# Patient Record
Sex: Male | Born: 1960 | Race: White | Hispanic: No | Marital: Married | Smoking: Current every day smoker
Health system: Southern US, Community
[De-identification: ages and names within clinical notes are randomized; demographics above are authoritative.]

## PROBLEM LIST (undated history)

## (undated) DIAGNOSIS — E785 Hyperlipidemia, unspecified: Secondary | ICD-10-CM

## (undated) DIAGNOSIS — E669 Obesity, unspecified: Secondary | ICD-10-CM

## (undated) DIAGNOSIS — E119 Type 2 diabetes mellitus without complications: Secondary | ICD-10-CM

## (undated) DIAGNOSIS — I10 Essential (primary) hypertension: Secondary | ICD-10-CM

## (undated) DIAGNOSIS — E1169 Type 2 diabetes mellitus with other specified complication: Secondary | ICD-10-CM

## (undated) HISTORY — DX: Hyperlipidemia, unspecified: E78.5

## (undated) HISTORY — PX: VASECTOMY: SHX75

## (undated) HISTORY — DX: Obesity, unspecified: E66.9

## (undated) HISTORY — DX: Essential (primary) hypertension: I10

## (undated) HISTORY — DX: Type 2 diabetes mellitus with other specified complication: E66.9

## (undated) HISTORY — DX: Type 2 diabetes mellitus with other specified complication: E11.69

---
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
## 2010-07-08 HISTORY — PX: COLONOSCOPY: SHX174

## 2019-04-05 ENCOUNTER — Ambulatory Visit (INDEPENDENT_AMBULATORY_CARE_PROVIDER_SITE_OTHER): Payer: Managed Care, Other (non HMO) | Admitting: Family Medicine

## 2019-04-05 ENCOUNTER — Other Ambulatory Visit: Payer: Self-pay

## 2019-04-05 ENCOUNTER — Encounter: Payer: Self-pay | Admitting: Family Medicine

## 2019-04-05 VITALS — BP 120/78 | HR 100 | Temp 97.9°F | Ht 66.0 in | Wt 176.4 lb

## 2019-04-05 DIAGNOSIS — Z0000 Encounter for general adult medical examination without abnormal findings: Secondary | ICD-10-CM | POA: Diagnosis not present

## 2019-04-05 DIAGNOSIS — E118 Type 2 diabetes mellitus with unspecified complications: Secondary | ICD-10-CM | POA: Diagnosis not present

## 2019-04-05 DIAGNOSIS — Z23 Encounter for immunization: Secondary | ICD-10-CM | POA: Diagnosis not present

## 2019-04-05 DIAGNOSIS — Z2821 Immunization not carried out because of patient refusal: Secondary | ICD-10-CM | POA: Diagnosis not present

## 2019-04-05 NOTE — Addendum Note (Signed)
Addended by: Sharon Seller B on: 04/05/2019 04:07 PM   Modules accepted: Orders

## 2019-04-05 NOTE — Patient Instructions (Addendum)
Give [place] 2-3 business days to get the results of your labs back.   Keep the diet clean and stay active.  I recommend getting the flu shot in mid October. This suggestion would change if the CDC comes out with a different recommendation.   The new Shingrix vaccine (for shingles) is a 2 shot series. It can make people feel low energy, achy and almost like they have the flu for 48 hours after injection. Please plan accordingly when deciding on when to get this shot. Call our office for a nurse visit appointment to get this. The second shot of the series is less severe regarding the side effects, but it still lasts 48 hours.    Call your eye doctor for an appointment.   Call your insurance company regarding if they pay for painful/irritated skin tag removal. If they do, let me know and we can remove them.   Let us know if you need anything.

## 2019-04-05 NOTE — Progress Notes (Signed)
Chief Complaint  Patient presents with  . New Patient (Initial Visit)    Well Male Jeffery Farrell is here for a complete physical.   His last physical was >1 year ago.  Current diet: in general, a "healthy" diet.  Current exercise: walking, active at work Weight trend: stable Daytime fatigue? No. Seat belt? Yes.    Health maintenance Shingrix- No Colonoscopy- Yes 09/27/2011 Tetanus- No HIV- No Hep C- Yes  Past Medical History:  Diagnosis Date  . Diabetes mellitus without complication ([place])   . Hyperlipidemia   . Hypertension      History reviewed. No pertinent surgical history.  Medications  Current Outpatient Medications on File Prior to Visit  Medication Sig Dispense Refill  . ALPRAZolam (XANAX) 0.5 MG tablet Take 1 tablet by mouth daily as needed.    . canagliflozin (INVOKANA) 300 MG TABS tablet Take 1 tablet by mouth daily.    Marland Kitchen glimepiride (AMARYL) 2 MG tablet Take 1 tablet by mouth daily.    Marland Kitchen lisinopril (ZESTRIL) 20 MG tablet Take 1 tablet by mouth daily.    Marland Kitchen atorvastatin (LIPITOR) 40 MG tablet Take 1 tablet by mouth daily.    Marland Kitchen levothyroxine (SYNTHROID) 150 MCG tablet Take 1 tablet by mouth daily.    Marland Kitchen omeprazole (PRILOSEC) 40 MG capsule Take 1 capsule by mouth daily.     Allergies No Known Allergies  Family History History reviewed. No pertinent family history.  Review of Systems: Constitutional:  no fevers Eye:  no recent significant change in vision Ear/Nose/Mouth/Throat:  Ears:  no hearing loss Nose/Mouth/Throat:  no complaints of nasal congestion, no sore throat Cardiovascular:  no chest pain, no palpitations Respiratory:  no cough and no shortness of breath Gastrointestinal:  no abdominal pain, no change in bowel habits GU:  Male: negative for dysuria, frequency, and incontinence and negative for prostate symptoms Musculoskeletal/Extremities:  no pain, redness, or swelling of the joints Integumentary (Skin/Breast):  no abnormal skin lesions  reported Neurologic:  no headaches Endocrine: No unexpected weight changes Hematologic/Lymphatic:  no abnormal bleeding  Exam BP 120/78 (BP Location: Left Arm, Patient Position: Sitting, Cuff Size: Normal)   Pulse 100   Temp 97.9 F (36.6 C) (Temporal)   Ht 5\' 6"  (1.676 m)   Wt 176 lb 6 oz (80 kg)   SpO2 96%   BMI 28.47 kg/m  General:  well developed, well nourished, in no apparent distress Skin:  no significant moles, warts, or growths Head:  no masses, lesions, or tenderness Eyes:  pupils equal and round, sclera anicteric without injection Ears:  canals without lesions, TMs shiny without retraction, no obvious effusion, no erythema Nose:  nares patent, septum midline, mucosa normal Throat/Pharynx:  lips and gingiva without lesion; tongue and uvula midline; non-inflamed pharynx; no exudates or postnasal drainage Neck: neck supple without adenopathy, thyromegaly, or masses Lungs:  clear to auscultation, breath sounds equal bilaterally, no respiratory distress Cardio:  regular rate and rhythm, no LE edema, no bruits Abdomen:  abdomen soft, nontender; bowel sounds normal; no masses or organomegaly Rectal: Deferred Musculoskeletal:  symmetrical muscle groups noted without atrophy or deformity Extremities:  no clubbing, cyanosis, or edema, no deformities, no skin discoloration Neuro:  gait normal; deep tendon reflexes normal and symmetric Psych: well oriented with normal range of affect and appropriate judgment/insight  Assessment and Plan  Well adult exam - Plan: CBC, Comprehensive metabolic panel, TSH, Lipid panel, HIV Antibody (routine testing w rflx)  Controlled type 2 diabetes mellitus with complication, without  long-term current use of insulin ([place]) - Plan: Hemoglobin A1c, Microalbumin / creatinine urine ratio  Refused influenza vaccine   Well 58 y.o. male. Counseled on diet and exercise. Counseled on risks and benefits of prostate cancer screening with PSA. The patient  agrees to forego testing. Immunizations, labs, and further orders as above. Follow up in 6 mo or prn. The patient voiced understanding and agreement to the plan.  [place], DO 04/05/19 4:03 PM

## 2019-04-06 LAB — CBC
HCT: 40.4 % (ref 39.0–52.0)
Hemoglobin: 13.8 g/dL (ref 13.0–17.0)
MCHC: 34.3 g/dL (ref 30.0–36.0)
MCV: 94.9 fl (ref 78.0–100.0)
Platelets: 273 10*3/uL (ref 150.0–400.0)
RBC: 4.26 Mil/uL (ref 4.22–5.81)
RDW: 12.8 % (ref 11.5–15.5)
WBC: 8.1 10*3/uL (ref 4.0–10.5)

## 2019-04-06 LAB — LIPID PANEL
Cholesterol: 157 mg/dL (ref 0–200)
HDL: 32.1 mg/dL — ABNORMAL LOW (ref >39.00)
Total CHOL/HDL Ratio: 5
Triglycerides: 551 mg/dL — ABNORMAL HIGH (ref 0.0–149.0)

## 2019-04-06 LAB — COMPREHENSIVE METABOLIC PANEL
ALT: 34 U/L (ref 0–53)
AST: 23 U/L (ref 0–37)
Albumin: 4.5 g/dL (ref 3.5–5.2)
Alkaline Phosphatase: 64 U/L (ref 39–117)
BUN: 16 mg/dL (ref 6–23)
CO2: 25 mEq/L (ref 19–32)
Calcium: 9.9 mg/dL (ref 8.4–10.5)
Chloride: 99 mEq/L (ref 96–112)
Creatinine, Ser: 1.13 mg/dL (ref 0.40–1.50)
GFR: 66.26 mL/min (ref >60.00)
Glucose, Bld: 153 mg/dL — ABNORMAL HIGH (ref 70–99)
Potassium: 4.2 mEq/L (ref 3.5–5.1)
Sodium: 134 mEq/L — ABNORMAL LOW (ref 135–145)
Total Bilirubin: 0.7 mg/dL (ref 0.2–1.2)
Total Protein: 7.6 g/dL (ref 6.0–8.3)

## 2019-04-06 LAB — TSH: TSH: 0.82 u[IU]/mL (ref 0.35–4.50)

## 2019-04-06 LAB — HIV ANTIBODY (ROUTINE TESTING W REFLEX): HIV 1&2 Ab, 4th Generation: NONREACTIVE

## 2019-04-06 LAB — HEMOGLOBIN A1C: Hgb A1c MFr Bld: 8.4 % — ABNORMAL HIGH (ref 4.6–6.5)

## 2019-04-06 LAB — LDL CHOLESTEROL, DIRECT: Direct LDL: 67 mg/dL

## 2019-04-06 LAB — MICROALBUMIN / CREATININE URINE RATIO
Creatinine,U: 40.2 mg/dL
Microalb Creat Ratio: 1.7 mg/g (ref 0.0–30.0)
Microalb, Ur: <0.7 mg/dL (ref 0.0–1.9)

## 2019-04-07 ENCOUNTER — Telehealth: Payer: Self-pay

## 2019-04-07 ENCOUNTER — Other Ambulatory Visit: Payer: Self-pay | Admitting: Family Medicine

## 2019-04-07 DIAGNOSIS — E785 Hyperlipidemia, unspecified: Secondary | ICD-10-CM

## 2019-04-07 MED ORDER — SITAGLIPTIN PHOSPHATE 100 MG PO TABS: 100.0000 mg | ORAL_TABLET | Freq: Every day | ORAL | 3 refills | Status: DC

## 2019-04-07 NOTE — Telephone Encounter (Signed)
PA approved.  [id number];Review Type:Prior Auth;Coverage Start Date:04/07/2019;Coverage End Date:07/07/2098

## 2019-04-07 NOTE — Telephone Encounter (Signed)
PA initiated via Covermymeds; KEY: [id number]. Awaiting determination.

## 2019-04-08 NOTE — Telephone Encounter (Signed)
Do we have any payment cards for Januvia? Onglyza we may have payment cards. Ty.

## 2019-04-08 NOTE — Telephone Encounter (Signed)
Patient called to inform the doctor that the medication he prescribed sitaGLIPtin (JANUVIA) 100 MG tablet, is too expensive ($400).  He stated that the doctor told him to let him know and he could get an alternative.  Please advise and call to discuss.  CB# [phone]

## 2019-04-08 NOTE — Telephone Encounter (Signed)
Please advise 

## 2019-04-09 MED ORDER — SAXAGLIPTIN HCL 5 MG PO TABS: 5.0000 mg | ORAL_TABLET | Freq: Every day | ORAL | 4 refills | Status: DC

## 2019-04-09 NOTE — Telephone Encounter (Signed)
Sent!

## 2019-04-09 NOTE — Telephone Encounter (Signed)
Found one of Onglyza

## 2019-04-09 NOTE — Telephone Encounter (Signed)
Let's send that to him/or he can pick up and call in that. Ty.

## 2019-04-09 NOTE — Addendum Note (Signed)
Addended by: Ames Coupe on: 04/09/2019 02:39 PM   Modules accepted: Orders

## 2019-04-09 NOTE — Telephone Encounter (Signed)
I have spoken to the patient and I am mailing the coupon card to him for onglyza--Please send in to pharmacy

## 2019-04-21 ENCOUNTER — Telehealth: Payer: Self-pay | Admitting: Family Medicine

## 2019-04-21 ENCOUNTER — Other Ambulatory Visit (INDEPENDENT_AMBULATORY_CARE_PROVIDER_SITE_OTHER): Payer: Managed Care, Other (non HMO)

## 2019-04-21 ENCOUNTER — Other Ambulatory Visit: Payer: Self-pay

## 2019-04-21 ENCOUNTER — Other Ambulatory Visit: Payer: Self-pay | Admitting: Family Medicine

## 2019-04-21 DIAGNOSIS — E785 Hyperlipidemia, unspecified: Secondary | ICD-10-CM | POA: Diagnosis not present

## 2019-04-21 LAB — LIPID PANEL
Cholesterol: 162 mg/dL (ref 0–200)
HDL: 34.8 mg/dL — ABNORMAL LOW (ref >39.00)
Total CHOL/HDL Ratio: 5
Triglycerides: 479 mg/dL — ABNORMAL HIGH (ref 0.0–149.0)

## 2019-04-21 LAB — LDL CHOLESTEROL, DIRECT: Direct LDL: 68 mg/dL

## 2019-04-21 MED ORDER — FENOFIBRATE MICRONIZED 67 MG PO CAPS: 67.0000 mg | ORAL_CAPSULE | Freq: Every day | ORAL | 3 refills | Status: DC

## 2019-04-21 NOTE — Telephone Encounter (Signed)
Error

## 2019-04-23 ENCOUNTER — Ambulatory Visit (INDEPENDENT_AMBULATORY_CARE_PROVIDER_SITE_OTHER): Payer: Managed Care, Other (non HMO) | Admitting: Family Medicine

## 2019-04-23 ENCOUNTER — Other Ambulatory Visit: Payer: Self-pay

## 2019-04-23 ENCOUNTER — Encounter: Payer: Self-pay | Admitting: Family Medicine

## 2019-04-23 VITALS — BP 136/84 | HR 92 | Temp 97.8°F | Ht 66.0 in | Wt 176.4 lb

## 2019-04-23 DIAGNOSIS — E669 Obesity, unspecified: Secondary | ICD-10-CM | POA: Diagnosis not present

## 2019-04-23 DIAGNOSIS — E1169 Type 2 diabetes mellitus with other specified complication: Secondary | ICD-10-CM | POA: Insufficient documentation

## 2019-04-23 MED ORDER — PIOGLITAZONE HCL 30 MG PO TABS: 30.0000 mg | ORAL_TABLET | Freq: Every day | ORAL | 3 refills | Status: DC

## 2019-04-23 NOTE — Progress Notes (Signed)
Chief Complaint  Patient presents with  . Follow-up    Subjective: Patient is a 58 y.o. male here for Dm discussion.  Most recent A1c was elevated at 8.4. He is compliant with Invokana 300 mg/d and Amaryl 2 mg/d. Januvia and Onglyza added, neither filled 2/2 cost. Diet is poor currently and he is not very active. Has not been checking sugars routinely.  ROS: Endo: No wt loss  Past Medical History:  Diagnosis Date  . Diabetes mellitus without complication ([place])   . Hyperlipidemia   . Hypertension     Objective: BP 136/84 (BP Location: Left Arm, Patient Position: Sitting, Cuff Size: Normal)   Pulse 92   Temp 97.8 F (36.6 C) (Temporal)   Ht 5\' 6"  (1.676 m)   Wt 176 lb 6 oz (80 kg)   SpO2 93%   BMI 28.47 kg/m  General: Awake, appears stated age Heart: RRR, no bruits, no LE edema Lungs: CTAB, no rales, wheezes or rhonchi. No accessory muscle use Psych: Age appropriate judgment and insight, normal affect and mood  Assessment and Plan: Diabetes mellitus type 2 in obese ([place]) - Plan: pioglitazone (ACTOS) 30 MG tablet  Orders as above. Will hold off on adding GLP-1 or DPP-4 inhibitor. Actos daily, cont Amaryl and Invokana. Counseled on diet and exercise. F/u in 3 mo.  The patient voiced understanding and agreement to the plan.  [place], DO 04/23/19  2:07 PM

## 2019-04-23 NOTE — Patient Instructions (Signed)
Let me know if there are any cost issues.  Keep the diet clean and stay active.  Goal weight for 3 month visit: 165-170 lbs on our scales  Let us know if you need anything.

## 2019-07-05 ENCOUNTER — Ambulatory Visit: Payer: Self-pay | Admitting: *Deleted

## 2019-07-05 NOTE — Telephone Encounter (Signed)
FYI

## 2019-07-05 NOTE — Telephone Encounter (Signed)
Summary: covid testing    Pt called and stated that he was exposed on 07/04/19 to covid and would like to know when he should go and get tested. Please advise      Patient's son tested + COVID and patient was with him yesterday. Advised wait 5 days from exposure before testing- if has symptoms can test earlier. Note to PCP- patient has appointment for diabetic follow up 1/22. Reason for Disposition . [1] CLOSE CONTACT COVID-19 EXPOSURE within last 14 days AND [2] NO symptoms  Answer Assessment - Initial Assessment Questions 1. COVID-19 CLOSE CONTACT: "Who is the person with the confirmed or suspected COVID-19 infection that you were exposed to?"     Son was at house 2. PLACE of CONTACT: "Where were you when you were exposed to COVID-19?" (e.g., home, school, medical waiting room; which city?)     12/27 3. TYPE of CONTACT: "How much contact was there?" (e.g., sitting next to, live in same house, work in same office, same building)     Visit in the home 4. DURATION of CONTACT: "How long were you in contact with the COVID-19 patient?" (e.g., a few seconds, passed by person, a few minutes, 15 minutes or longer, live with the patient)     Half day 5. MASK: "Were you wearing a mask?" "Was the other person wearing a mask?" Note: wearing a mask reduces the risk of an  otherwise close contact.     no 6. DATE of CONTACT: "When did you have contact with a COVID-19 patient?" (e.g., how many days ago)     12/27 7. COMMUNITY SPREAD: "Are there lots of cases of COVID-19 (community spread) where you live?" (See public health department website, if unsure)       community 8. SYMPTOMS: "Do you have any symptoms?" (e.g., fever, cough, breathing difficulty, loss of taste or smell)     No symptoms 9. PREGNANCY OR POSTPARTUM: "Is there any chance you are pregnant?" "When was your last menstrual period?" "Did you deliver in the last 2 weeks?"     no 10. HIGH RISK: "Do you have any heart or lung problems? Do you  have a weak immune system?" (e.g., heart failure, COPD, asthma, HIV positive, chemotherapy, renal failure, diabetes mellitus, sickle cell anemia, obesity)      Type 2 diabetic 11.  TRAVEL: "Have you traveled out of the country recently?" If so, "When and where?"  Also ask about out-of-state travel, since the CDC has identified some high-risk cities for community spread in the [place].  Note: Travel becomes less relevant if there is widespread community transmission where the patient lives.       No travel  Protocols used: CORONAVIRUS (COVID-19) EXPOSURE-A-AH

## 2019-07-07 ENCOUNTER — Ambulatory Visit: Payer: Managed Care, Other (non HMO) | Admitting: Family Medicine

## 2019-07-08 ENCOUNTER — Ambulatory Visit: Payer: Managed Care, Other (non HMO) | Attending: Internal Medicine

## 2019-07-08 DIAGNOSIS — Z20822 Contact with and (suspected) exposure to covid-19: Secondary | ICD-10-CM

## 2019-07-09 LAB — NOVEL CORONAVIRUS, NAA: SARS-CoV-2, NAA: NOT DETECTED

## 2019-07-14 ENCOUNTER — Telehealth: Payer: Self-pay | Admitting: Family Medicine

## 2019-07-14 NOTE — Telephone Encounter (Signed)
Pt is aware covid 19 test is neg on 07/14/2019 

## 2019-07-14 NOTE — Telephone Encounter (Signed)
Pt would like you to fax his results to his employer.  Pt declined mychart.  Pt states he does not have access to a computer, and unsure how to do. 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  [place] Attn: Binnie Kand Fax:  [phone]

## 2019-07-24 ENCOUNTER — Other Ambulatory Visit: Payer: Self-pay | Admitting: Family Medicine

## 2019-07-24 DIAGNOSIS — E1169 Type 2 diabetes mellitus with other specified complication: Secondary | ICD-10-CM

## 2019-07-24 DIAGNOSIS — E669 Obesity, unspecified: Secondary | ICD-10-CM

## 2019-07-29 ENCOUNTER — Other Ambulatory Visit: Payer: Self-pay

## 2019-07-30 ENCOUNTER — Encounter: Payer: Self-pay | Admitting: Family Medicine

## 2019-07-30 ENCOUNTER — Ambulatory Visit (INDEPENDENT_AMBULATORY_CARE_PROVIDER_SITE_OTHER): Payer: Managed Care, Other (non HMO) | Admitting: Family Medicine

## 2019-07-30 VITALS — BP 130/82 | HR 82 | Temp 97.3°F | Ht 65.0 in | Wt 184.0 lb

## 2019-07-30 DIAGNOSIS — E1169 Type 2 diabetes mellitus with other specified complication: Secondary | ICD-10-CM | POA: Diagnosis not present

## 2019-07-30 DIAGNOSIS — R635 Abnormal weight gain: Secondary | ICD-10-CM | POA: Diagnosis not present

## 2019-07-30 DIAGNOSIS — E669 Obesity, unspecified: Secondary | ICD-10-CM

## 2019-07-30 LAB — HEMOGLOBIN A1C: Hgb A1c MFr Bld: 7 % — ABNORMAL HIGH (ref 4.6–6.5)

## 2019-07-30 LAB — T4, FREE: Free T4: 0.75 ng/dL (ref 0.60–1.60)

## 2019-07-30 LAB — TSH: TSH: 4.86 u[IU]/mL — ABNORMAL HIGH (ref 0.35–4.50)

## 2019-07-30 NOTE — Progress Notes (Signed)
Subjective:   Chief Complaint  Patient presents with  . Follow-up    diabetes    Jeffery Farrell is a 59 y.o. male here for follow-up of diabetes.   Jeffery Farrell does not ck sugars.  Patient does not require insulin.   Medications include: Amaryl 2 mg/d, Actos 30 mg/d, Invokana 300 mg/d; not interested in needles, does not do well w Metformin, DPP-4 inhibitors are too expensive Diet: Reports diet is healthy overall but has gained wt abruptly since last visit.  Exercise: walking  Past Medical History:  Diagnosis Date  . Diabetes mellitus without complication ([place])   . Hyperlipidemia   . Hypertension      Related testing: Date of retinal exam: Done Pneumovax: done Flu Shot: not done  Review of Systems: Pulmonary:  No SOB Cardiovascular:  No chest pain  Objective:  BP 130/82 (BP Location: Left Arm, Patient Position: Sitting, Cuff Size: Normal)   Pulse 82   Temp (!) 97.3 F (36.3 C) (Temporal)   Ht 5\' 5"  (1.651 m)   Wt 184 lb (83.5 kg)   SpO2 95%   BMI 30.62 kg/m  General:  Well developed, well nourished, in no apparent distress Skin:  Warm, no pallor or diaphoresis Head:  Normocephalic, atraumatic Eyes:  Pupils equal and round, sclera anicteric without injection  Lungs:  CTAB, no access msc use Cardio:  RRR, no bruits, no LE edema Musculoskeletal:  Symmetrical muscle groups noted without atrophy or deformity Neuro:  Sensation intact to pinprick on feet Psych: Age appropriate judgment and insight  Assessment:   Diabetes mellitus type 2 in obese ([facility]) - Plan: Hemoglobin A1c, HM DIABETES FOOT EXAM  Weight gain - Plan: TSH, T4, free   Plan:   Not interested in GLP-1's. Could consider wt loss team vs dietician. Counseled on diet and exercise. Ck thyroid.  F/u in 3 mo. The patient voiced understanding and agreement to the plan.  [place], DO 07/30/19 1:22 PM

## 2019-07-30 NOTE — Patient Instructions (Signed)
Give us 2-3 business days to get the results of your labs back.   Keep the diet clean and stay active.  Let us know if you need anything. 

## 2019-10-01 ENCOUNTER — Ambulatory Visit: Payer: Managed Care, Other (non HMO) | Admitting: Family Medicine

## 2019-10-28 ENCOUNTER — Other Ambulatory Visit: Payer: Self-pay

## 2019-10-29 ENCOUNTER — Other Ambulatory Visit: Payer: Self-pay | Admitting: Family Medicine

## 2019-10-29 ENCOUNTER — Encounter: Payer: Self-pay | Admitting: Family Medicine

## 2019-10-29 ENCOUNTER — Ambulatory Visit (INDEPENDENT_AMBULATORY_CARE_PROVIDER_SITE_OTHER): Payer: Managed Care, Other (non HMO) | Admitting: Family Medicine

## 2019-10-29 VITALS — BP 132/78 | HR 93 | Temp 96.1°F | Ht 66.0 in | Wt 185.1 lb

## 2019-10-29 DIAGNOSIS — E669 Obesity, unspecified: Secondary | ICD-10-CM | POA: Diagnosis not present

## 2019-10-29 DIAGNOSIS — F411 Generalized anxiety disorder: Secondary | ICD-10-CM | POA: Diagnosis not present

## 2019-10-29 DIAGNOSIS — E1169 Type 2 diabetes mellitus with other specified complication: Secondary | ICD-10-CM | POA: Diagnosis not present

## 2019-10-29 DIAGNOSIS — E782 Mixed hyperlipidemia: Secondary | ICD-10-CM

## 2019-10-29 LAB — LIPID PANEL
Cholesterol: 186 mg/dL (ref 0–200)
HDL: 36.3 mg/dL — ABNORMAL LOW (ref >39.00)
NonHDL: 149.88
Total CHOL/HDL Ratio: 5
Triglycerides: 201 mg/dL — ABNORMAL HIGH (ref 0.0–149.0)
VLDL: 40.2 mg/dL — ABNORMAL HIGH (ref 0.0–40.0)

## 2019-10-29 LAB — COMPREHENSIVE METABOLIC PANEL
ALT: 43 U/L (ref 0–53)
AST: 32 U/L (ref 0–37)
Albumin: 4.7 g/dL (ref 3.5–5.2)
Alkaline Phosphatase: 52 U/L (ref 39–117)
BUN: 14 mg/dL (ref 6–23)
CO2: 28 mEq/L (ref 19–32)
Calcium: 9.7 mg/dL (ref 8.4–10.5)
Chloride: 99 mEq/L (ref 96–112)
Creatinine, Ser: 1.13 mg/dL (ref 0.40–1.50)
GFR: 66.13 mL/min (ref >60.00)
Glucose, Bld: 117 mg/dL — ABNORMAL HIGH (ref 70–99)
Potassium: 4.4 mEq/L (ref 3.5–5.1)
Sodium: 137 mEq/L (ref 135–145)
Total Bilirubin: 0.8 mg/dL (ref 0.2–1.2)
Total Protein: 7.3 g/dL (ref 6.0–8.3)

## 2019-10-29 LAB — LDL CHOLESTEROL, DIRECT: Direct LDL: 111 mg/dL

## 2019-10-29 LAB — HEMOGLOBIN A1C: Hgb A1c MFr Bld: 6.7 % — ABNORMAL HIGH (ref 4.6–6.5)

## 2019-10-29 MED ORDER — FENOFIBRATE 145 MG PO TABS
145.0000 mg | ORAL_TABLET | Freq: Every day | ORAL | 2 refills | Status: DC
Start: 2019-10-29 — End: 2020-01-21

## 2019-10-29 MED ORDER — LISINOPRIL 20 MG PO TABS: 10.0000 mg | ORAL_TABLET | Freq: Every day | ORAL | Status: DC

## 2019-10-29 MED ORDER — ALPRAZOLAM 0.5 MG PO TABS: 0.5000 mg | ORAL_TABLET | Freq: Every day | ORAL | 1 refills | Status: DC | PRN

## 2019-10-29 NOTE — Patient Instructions (Addendum)
Give [place] 2-3 business days to get the results of your labs back.   Keep the diet clean and stay active.  Call your eye provider to get an exam.   Keep an eye on your blood pressure at home. We can take 1/2 tab of the lisinopril (which is for blood pressure). I want your blood pressure less than 140 on the top and less than 90 on the bottom consistently.   Let us know if you need anything.

## 2019-10-29 NOTE — Progress Notes (Signed)
Subjective:   Chief Complaint  Patient presents with  . Follow-up    Jeffery Farrell is a 59 y.o. male here for follow-up of diabetes.   Jeffery Farrell does not monitor his blood pressures.  Patient does not require insulin.   Medications include: glimepiride 2 mg daily, Actos 30 mg/d Diet is fair Exercise: walking  Hypertension Patient presents for hypertension follow up. He does monitor home blood pressures. Blood pressures ranging on average from 120-130's/70's. He is compliant with medication- lisinopril 20 mg/d. Patient has these side effects of medication: none  Anxiety Pt w hx of anxiety. Takes Xanax intermittently. Last filled 30 tabs around 1 yr ago.   Past Medical History:  Diagnosis Date  . Diabetes mellitus without complication (Jeffery Farrell)   . Hyperlipidemia   . Hypertension      Related testing: Date of retinal exam: Due Pneumovax: done   Objective:  BP 132/78 (BP Location: Left Arm, Patient Position: Sitting, Cuff Size: Normal)   Pulse 93   Temp (!) 96.1 F (35.6 C) (Temporal)   Ht 5\' 6"  (1.676 m)   Wt 185 lb 2 oz (84 kg)   SpO2 95%   BMI 29.88 kg/m  General:  Well developed, well nourished, in no apparent distress Skin:  Warm, no pallor or diaphoresis Head:  Normocephalic, atraumatic Eyes:  Pupils equal and round, sclera anicteric without injection  Lungs:  CTAB, no access msc use Cardio:  RRR, no bruits, no LE edema Psych: Age appropriate judgment and insight  Assessment:   Diabetes mellitus type 2 in obese ([facility]) - Plan: Comprehensive metabolic panel, Hemoglobin A1c  Mixed hyperlipidemia - Plan: Lipid panel  GAD (generalized anxiety disorder)   Plan:   1- I think he will be controlled today. Counseled on diet and exercise. Could increase Amaryl.  2- Cont statin and fibrate. Ck lipids today.  3- Uses scant amounts of benzo, will refill.  F/u in 3-6 mo pending above.. The patient voiced understanding and agreement to the plan.  [place],  DO 10/29/19 7:13 AM

## 2019-11-30 ENCOUNTER — Other Ambulatory Visit: Payer: Self-pay | Admitting: Family Medicine

## 2019-11-30 DIAGNOSIS — E1169 Type 2 diabetes mellitus with other specified complication: Secondary | ICD-10-CM

## 2019-12-10 ENCOUNTER — Other Ambulatory Visit: Payer: Self-pay | Admitting: Family Medicine

## 2019-12-10 ENCOUNTER — Other Ambulatory Visit: Payer: Self-pay

## 2019-12-10 ENCOUNTER — Other Ambulatory Visit (INDEPENDENT_AMBULATORY_CARE_PROVIDER_SITE_OTHER): Payer: Managed Care, Other (non HMO)

## 2019-12-10 DIAGNOSIS — E782 Mixed hyperlipidemia: Secondary | ICD-10-CM | POA: Diagnosis not present

## 2019-12-10 LAB — LIPID PANEL
Cholesterol: 165 mg/dL (ref 0–200)
HDL: 36.4 mg/dL — ABNORMAL LOW (ref >39.00)
NonHDL: 128.85
Total CHOL/HDL Ratio: 5
Triglycerides: 232 mg/dL — ABNORMAL HIGH (ref 0.0–149.0)
VLDL: 46.4 mg/dL — ABNORMAL HIGH (ref 0.0–40.0)

## 2019-12-10 LAB — LDL CHOLESTEROL, DIRECT: Direct LDL: 91 mg/dL

## 2019-12-10 MED ORDER — ATORVASTATIN CALCIUM 80 MG PO TABS: 80.0000 mg | ORAL_TABLET | Freq: Every day | ORAL | 5 refills | Status: DC

## 2020-01-12 ENCOUNTER — Telehealth: Payer: Self-pay

## 2020-01-12 NOTE — Telephone Encounter (Signed)
Called to confirm 2 pm appointment is ok for him for Friday January 14, 2020. Is a same day appt and will have manager put him on.

## 2020-01-12 NOTE — Telephone Encounter (Signed)
Caller states he was placed on cholesterol medication. He is having weight gain and muscle cramps. Last night he only received about 3 hours sleep due to the cramps. He is requesting an appointment on a Friday after lunch

## 2020-01-14 ENCOUNTER — Ambulatory Visit: Payer: Managed Care, Other (non HMO) | Admitting: Family Medicine

## 2020-01-21 ENCOUNTER — Other Ambulatory Visit: Payer: Self-pay

## 2020-01-21 ENCOUNTER — Encounter: Payer: Self-pay | Admitting: Family Medicine

## 2020-01-21 ENCOUNTER — Ambulatory Visit (INDEPENDENT_AMBULATORY_CARE_PROVIDER_SITE_OTHER): Payer: Managed Care, Other (non HMO) | Admitting: Family Medicine

## 2020-01-21 VITALS — BP 132/86 | HR 91 | Temp 98.2°F | Ht 66.0 in | Wt 188.2 lb

## 2020-01-21 DIAGNOSIS — E782 Mixed hyperlipidemia: Secondary | ICD-10-CM

## 2020-01-21 DIAGNOSIS — E1169 Type 2 diabetes mellitus with other specified complication: Secondary | ICD-10-CM | POA: Diagnosis not present

## 2020-01-21 DIAGNOSIS — M545 Low back pain, unspecified: Secondary | ICD-10-CM | POA: Insufficient documentation

## 2020-01-21 DIAGNOSIS — E669 Obesity, unspecified: Secondary | ICD-10-CM | POA: Diagnosis not present

## 2020-01-21 DIAGNOSIS — G8929 Other chronic pain: Secondary | ICD-10-CM

## 2020-01-21 MED ORDER — FENOFIBRATE 145 MG PO TABS: 145.0000 mg | ORAL_TABLET | Freq: Every day | ORAL | 2 refills | Status: DC

## 2020-01-21 NOTE — Patient Instructions (Addendum)
Keep the diet clean and stay active.  Stay hydrated.   Continue checking your blood pressure and sugars.   Consider weight resistance exercise like push ups, sit ups, etc.    Keep a nutrition journal and count calories.  Let us know if you need anything.  Healthy Eating Plan Many factors influence your heart health, including eating and exercise habits. Heart (coronary) risk increases with abnormal blood fat (lipid) levels. Heart-healthy meal planning includes limiting unhealthy fats, increasing healthy fats, and making other small dietary changes. This includes maintaining a healthy body weight to help keep lipid levels within a normal range.  WHAT IS MY PLAN?  Your health care provider recommends that you:  Drink a glass of water before meals to help with satiety.  Eat slowly.  An alternative to the water is to add Metamucil. This will help with satiety as well. It does contain calories, unlike water.  WHAT TYPES OF FAT SHOULD I CHOOSE?  Choose healthy fats more often. Choose monounsaturated and polyunsaturated fats, such as olive oil and canola oil, flaxseeds, walnuts, almonds, and seeds.  Eat more omega-3 fats. Good choices include salmon, mackerel, sardines, tuna, flaxseed oil, and ground flaxseeds. Aim to eat fish at least two times each week.  Avoid foods with partially hydrogenated oils in them. These contain trans fats. Examples of foods that contain trans fats are stick margarine, some tub margarines, cookies, crackers, and other baked goods. If you are going to avoid a fat, this is the one to avoid!  WHAT GENERAL GUIDELINES DO I NEED TO FOLLOW?  Check food labels carefully to identify foods with trans fats. Avoid these types of options when possible.  Fill one half of your plate with vegetables and green salads. Eat 4-5 servings of vegetables per day. A serving of vegetables equals 1 cup of raw leafy vegetables,  cup of raw or cooked cut-up vegetables, or  cup of  vegetable juice.  Fill one fourth of your plate with whole grains. Look for the word "whole" as the first word in the ingredient list.  Fill one fourth of your plate with lean protein foods.  Eat 4-5 servings of fruit per day. A serving of fruit equals one medium whole fruit,  cup of dried fruit,  cup of fresh, frozen, or canned fruit. Try to avoid fruits in cups/syrups as the sugar content can be high.  Eat more foods that contain soluble fiber. Examples of foods that contain this type of fiber are apples, broccoli, carrots, beans, peas, and barley. Aim to get 20-30 g of fiber per day.  Eat more home-cooked food and less restaurant, buffet, and fast food.  Limit or avoid alcohol.  Limit foods that are high in starch and sugar.  Avoid fried foods when able.  Cook foods by using methods other than frying. Baking, boiling, grilling, and broiling are all great options. Other fat-reducing suggestions include: ? Removing the skin from poultry. ? Removing all visible fats from meats. ? Skimming the fat off of stews, soups, and gravies before serving them. ? Steaming vegetables in water or broth.  Lose weight if you are overweight. Losing just 5-10% of your initial body weight can help your overall health and prevent diseases such as diabetes and heart disease.  Increase your consumption of nuts, legumes, and seeds to 4-5 servings per week. One serving of dried beans or legumes equals  cup after being cooked, one serving of nuts equals 1 ounces, and one serving of seeds  equals  ounce or 1 tablespoon.  WHAT ARE GOOD FOODS CAN I EAT? Grains Grainy breads (try to find bread that is 3 g of fiber per slice or greater), oatmeal, light popcorn. Whole-grain cereals. Rice and pasta, including brown rice and those that are made with whole wheat. Edamame pasta is a great alternative to grain pasta. It has a higher protein content. Try to avoid significant consumption of white bread, sugary cereals,  or pastries/baked goods.  Vegetables All vegetables. Cooked white potatoes do not count as vegetables.  Fruits All fruits, but limit pineapple and bananas as these fruits have a higher sugar content.  Meats and Other Protein Sources Lean, well-trimmed beef, veal, pork, and lamb. Chicken and [place] without skin. All fish and shellfish. Wild duck, rabbit, pheasant, and venison. Egg whites or low-cholesterol egg substitutes. Dried beans, peas, lentils, and tofu.Seeds and most nuts.  Dairy Low-fat or nonfat cheeses, including ricotta, string, and mozzarella. Skim or 1% milk that is liquid, powdered, or evaporated. Buttermilk that is made with low-fat milk. Nonfat or low-fat yogurt. Soy/Almond milk are good alternatives if you cannot handle dairy.  Beverages Water is the best for you. Sports drinks with less sugar are more desirable unless you are a highly active athlete.  Sweets and Desserts Sherbets and fruit ices. Honey, jam, marmalade, jelly, and syrups. Dark chocolate.  Eat all sweets and desserts in moderation.  Fats and Oils Nonhydrogenated (trans-free) margarines. Vegetable oils, including soybean, sesame, sunflower, olive, peanut, safflower, corn, canola, and cottonseed. Salad dressings or mayonnaise that are made with a vegetable oil. Limit added fats and oils that you use for cooking, baking, salads, and as spreads.  Other Cocoa powder. Coffee and tea. Most condiments.  EXERCISES  RANGE OF MOTION (ROM) AND STRETCHING EXERCISES - Low Back Pain Most people with lower back pain will find that their symptoms get worse with excessive bending forward (flexion) or arching at the lower back (extension). The exercises that will help resolve your symptoms will focus on the opposite motion.  If you have pain, numbness or tingling which travels down into your buttocks, leg or foot, the goal of the therapy is for these symptoms to move closer to your back and eventually resolve. Sometimes,  these leg symptoms will get better, but your lower back pain may worsen. This is often an indication of progress in your rehabilitation. Be very alert to any changes in your symptoms and the activities in which you participated in the 24 hours prior to the change. Sharing this information with your caregiver will allow him or her to most efficiently treat your condition. These exercises may help you when beginning to rehabilitate your injury. Your symptoms may resolve with or without further involvement from your physician, physical therapist or athletic trainer. While completing these exercises, remember:   Restoring tissue flexibility helps normal motion to return to the joints. This allows healthier, less painful movement and activity.  An effective stretch should be held for at least 30 seconds.  A stretch should never be painful. You should only feel a gentle lengthening or release in the stretched tissue. FLEXION RANGE OF MOTION AND STRETCHING EXERCISES:  STRETCH - Flexion, Single Knee to Chest   Lie on a firm bed or floor with both legs extended in front of you.  Keeping one leg in contact with the floor, bring your opposite knee to your chest. Hold your leg in place by either grabbing behind your thigh or at your knee.  Pull  until you feel a gentle stretch in your low back. Hold 30 seconds.  Slowly release your grasp and repeat the exercise with the opposite side. Repeat 2 times. Complete this exercise 3 times per week.   STRETCH - Flexion, Double Knee to Chest  Lie on a firm bed or floor with both legs extended in front of you.  Keeping one leg in contact with the floor, bring your opposite knee to your chest.  Tense your stomach muscles to support your back and then lift your other knee to your chest. Hold your legs in place by either grabbing behind your thighs or at your knees.  Pull both knees toward your chest until you feel a gentle stretch in your low back. Hold 30  seconds.  Tense your stomach muscles and slowly return one leg at a time to the floor. Repeat 2 times. Complete this exercise 3 times per week.   STRETCH - Low Trunk Rotation  Lie on a firm bed or floor. Keeping your legs in front of you, bend your knees so they are both pointed toward the ceiling and your feet are flat on the floor.  Extend your arms out to the side. This will stabilize your upper body by keeping your shoulders in contact with the floor.  Gently and slowly drop both knees together to one side until you feel a gentle stretch in your low back. Hold for 30 seconds.  Tense your stomach muscles to support your lower back as you bring your knees back to the starting position. Repeat the exercise to the other side. Repeat 2 times. Complete this exercise at least 3 times per week.   EXTENSION RANGE OF MOTION AND FLEXIBILITY EXERCISES:  STRETCH - Extension, Prone on Elbows   Lie on your stomach on the floor, a bed will be too soft. Place your palms about shoulder width apart and at the height of your head.  Place your elbows under your shoulders. If this is too painful, stack pillows under your chest.  Allow your body to relax so that your hips drop lower and make contact more completely with the floor.  Hold this position for 30 seconds.  Slowly return to lying flat on the floor. Repeat 2 times. Complete this exercise 3 times per week.   RANGE OF MOTION - Extension, Prone Press Ups  Lie on your stomach on the floor, a bed will be too soft. Place your palms about shoulder width apart and at the height of your head.  Keeping your back as relaxed as possible, slowly straighten your elbows while keeping your hips on the floor. You may adjust the placement of your hands to maximize your comfort. As you gain motion, your hands will come more underneath your shoulders.  Hold this position 30 seconds.  Slowly return to lying flat on the floor. Repeat 2 times. Complete this  exercise 3 times per week.   RANGE OF MOTION- Quadruped, Neutral Spine   Assume a hands and knees position on a firm surface. Keep your hands under your shoulders and your knees under your hips. You may place padding under your knees for comfort.  Drop your head and point your tailbone toward the ground below you. This will round out your lower back like an angry cat. Hold this position for 30 seconds.  Slowly lift your head and release your tail bone so that your back sags into a large arch, like an old horse.  Hold this position for 30  seconds.  Repeat this until you feel limber in your low back.  Now, find your "sweet spot." This will be the most comfortable position somewhere between the two previous positions. This is your neutral spine. Once you have found this position, tense your stomach muscles to support your low back.  Hold this position for 30 seconds. Repeat 2 times. Complete this exercise 3 times per week.   STRENGTHENING EXERCISES - Low Back Sprain These exercises may help you when beginning to rehabilitate your injury. These exercises should be done near your "sweet spot." This is the neutral, low-back arch, somewhere between fully rounded and fully arched, that is your least painful position. When performed in this safe range of motion, these exercises can be used for people who have either a flexion or extension based injury. These exercises may resolve your symptoms with or without further involvement from your physician, physical therapist or athletic trainer. While completing these exercises, remember:   Muscles can gain both the endurance and the strength needed for everyday activities through controlled exercises.  Complete these exercises as instructed by your physician, physical therapist or athletic trainer. Increase the resistance and repetitions only as guided.  You may experience muscle soreness or fatigue, but the pain or discomfort you are trying to eliminate  should never worsen during these exercises. If this pain does worsen, stop and make certain you are following the directions exactly. If the pain is still present after adjustments, discontinue the exercise until you can discuss the trouble with your caregiver.  STRENGTHENING - Deep Abdominals, Pelvic Tilt   Lie on a firm bed or floor. Keeping your legs in front of you, bend your knees so they are both pointed toward the ceiling and your feet are flat on the floor.  Tense your lower abdominal muscles to press your low back into the floor. This motion will rotate your pelvis so that your tail bone is scooping upwards rather than pointing at your feet or into the floor. With a gentle tension and even breathing, hold this position for 3 seconds. Repeat 2 times. Complete this exercise 3 times per week.   STRENGTHENING - Abdominals, Crunches   Lie on a firm bed or floor. Keeping your legs in front of you, bend your knees so they are both pointed toward the ceiling and your feet are flat on the floor. Cross your arms over your chest.  Slightly tip your chin down without bending your neck.  Tense your abdominals and slowly lift your trunk high enough to just clear your shoulder blades. Lifting higher can put excessive stress on the lower back and does not further strengthen your abdominal muscles.  Control your return to the starting position. Repeat 2 times. Complete this exercise 3 times per week.   STRENGTHENING - Quadruped, Opposite UE/LE Lift   Assume a hands and knees position on a firm surface. Keep your hands under your shoulders and your knees under your hips. You may place padding under your knees for comfort.  Find your neutral spine and gently tense your abdominal muscles so that you can maintain this position. Your shoulders and hips should form a rectangle that is parallel with the floor and is not twisted.  Keeping your trunk steady, lift your right hand no higher than your  shoulder and then your left leg no higher than your hip. Make sure you are not holding your breath. Hold this position for 30 seconds.  Continuing to keep your abdominal muscles tense  and your back steady, slowly return to your starting position. Repeat with the opposite arm and leg. Repeat 2 times. Complete this exercise 3 times per week.   STRENGTHENING - Abdominals and Quadriceps, Straight Leg Raise   Lie on a firm bed or floor with both legs extended in front of you.  Keeping one leg in contact with the floor, bend the other knee so that your foot can rest flat on the floor.  Find your neutral spine, and tense your abdominal muscles to maintain your spinal position throughout the exercise.  Slowly lift your straight leg off the floor about 6 inches for a count of 3, making sure to not hold your breath.  Still keeping your neutral spine, slowly lower your leg all the way to the floor. Repeat this exercise with each leg 2 times. Complete this exercise 3 times per week.  POSTURE AND BODY MECHANICS CONSIDERATIONS - Low Back Sprain Keeping correct posture when sitting, standing or completing your activities will reduce the stress put on different body tissues, allowing injured tissues a chance to heal and limiting painful experiences. The following are general guidelines for improved posture.  While reading these guidelines, remember:  The exercises prescribed by your provider will help you have the flexibility and strength to maintain correct postures.  The correct posture provides the best environment for your joints to work. All of your joints have less wear and tear when properly supported by a spine with good posture. This means you will experience a healthier, less painful body.  Correct posture must be practiced with all of your activities, especially prolonged sitting and standing. Correct posture is as important when doing repetitive low-stress activities (typing) as it is when doing  a single heavy-load activity (lifting).  RESTING POSITIONS Consider which positions are most painful for you when choosing a resting position. If you have pain with flexion-based activities (sitting, bending, stooping, squatting), choose a position that allows you to rest in a less flexed posture. You would want to avoid curling into a fetal position on your side. If your pain worsens with extension-based activities (prolonged standing, working overhead), avoid resting in an extended position such as sleeping on your stomach. Most people will find more comfort when they rest with their spine in a more neutral position, neither too rounded nor too arched. Lying on a non-sagging bed on your side with a pillow between your knees, or on your back with a pillow under your knees will often provide some relief. Keep in mind, being in any one position for a prolonged period of time, no matter how correct your posture, can still lead to stiffness.  PROPER SITTING POSTURE In order to minimize stress and discomfort on your spine, you must sit with correct posture. Sitting with good posture should be effortless for a healthy body. Returning to good posture is a gradual process. Many people can work toward this most comfortably by using various supports until they have the flexibility and strength to maintain this posture on their own. When sitting with proper posture, your ears will fall over your shoulders and your shoulders will fall over your hips. You should use the back of the chair to support your upper back. Your lower back will be in a neutral position, just slightly arched. You may place a small pillow or folded towel at the base of your lower back for  support.  When working at a desk, create an environment that supports good, upright posture. Without extra  support, muscles tire, which leads to excessive strain on joints and other tissues. Keep these recommendations in mind:  CHAIR:  A chair should be  able to slide under your desk when your back makes contact with the back of the chair. This allows you to work closely.  The chair's height should allow your eyes to be level with the upper part of your monitor and your hands to be slightly lower than your elbows.  BODY POSITION  Your feet should make contact with the floor. If this is not possible, use a foot rest.  Keep your ears over your shoulders. This will reduce stress on your neck and low back.  INCORRECT SITTING POSTURES  If you are feeling tired and unable to assume a healthy sitting posture, do not slouch or slump. This puts excessive strain on your back tissues, causing more damage and pain. Healthier options include:  Using more support, like a lumbar pillow.  Switching tasks to something that requires you to be upright or walking.  Talking a brief walk.  Lying down to rest in a neutral-spine position.  PROLONGED STANDING WHILE SLIGHTLY LEANING FORWARD  When completing a task that requires you to lean forward while standing in one place for a long time, place either foot up on a stationary 2-4 inch high object to help maintain the best posture. When both feet are on the ground, the lower back tends to lose its slight inward curve. If this curve flattens (or becomes too large), then the back and your other joints will experience too much stress, tire more quickly, and can cause pain.  CORRECT STANDING POSTURES Proper standing posture should be assumed with all daily activities, even if they only take a few moments, like when brushing your teeth. As in sitting, your ears should fall over your shoulders and your shoulders should fall over your hips. You should keep a slight tension in your abdominal muscles to brace your spine. Your tailbone should point down to the ground, not behind your body, resulting in an over-extended swayback posture.   INCORRECT STANDING POSTURES  Common incorrect standing postures include a forward  head, locked knees and/or an excessive swayback. WALKING Walk with an upright posture. Your ears, shoulders and hips should all line-up.  PROLONGED ACTIVITY IN A FLEXED POSITION When completing a task that requires you to bend forward at your waist or lean over a low surface, try to find a way to stabilize 3 out of 4 of your limbs. You can place a hand or elbow on your thigh or rest a knee on the surface you are reaching across. This will provide you more stability, so that your muscles do not tire as quickly. By keeping your knees relaxed, or slightly bent, you will also reduce stress across your lower back. CORRECT LIFTING TECHNIQUES  DO :  Assume a wide stance. This will provide you more stability and the opportunity to get as close as possible to the object which you are lifting.  Tense your abdominals to brace your spine. Bend at the knees and hips. Keeping your back locked in a neutral-spine position, lift using your leg muscles. Lift with your legs, keeping your back straight.  Test the weight of unknown objects before attempting to lift them.  Try to keep your elbows locked down at your sides in order get the best strength from your shoulders when carrying an object.     Always ask for help when lifting heavy or awkward objects.  INCORRECT LIFTING TECHNIQUES DO NOT:   Lock your knees when lifting, even if it is a small object.  Bend and twist. Pivot at your feet or move your feet when needing to change directions.  Assume that you can safely pick up even a paperclip without proper posture.

## 2020-01-21 NOTE — Progress Notes (Signed)
Chief Complaint  Patient presents with  . Medication Problem    Subjective Jeffery Farrell is a 58 y.o. male who presents for hypertension follow up. He does monitor home blood pressures. Blood pressures ranging from 120-130's/70's on average. He is no longer on any medications.  He is adhering to a healthy diet overall. Current exercise: walking  DM2 Sugars in mid 100's post-prandial. No meds. Is on statin. Diet/exercise as above. Currently diet controlled. No lows.   Hyperlipidemia Patient presents for dyslipidemia follow up. Currently being treated with Lipitor 80 mg/d and compliance with treatment thus far has been good. He denies myalgias. Diet/exercise as above.  The patient is not known to have coexisting coronary artery disease.   Past Medical History:  Diagnosis Date  . Diabetes mellitus without complication ([place])   . Hyperlipidemia   . Hypertension     Exam BP 132/86 (BP Location: Left Arm, Patient Position: Sitting, Cuff Size: Normal)   Pulse 91   Temp 98.2 F (36.8 C) (Oral)   Ht 5\' 6"  (1.676 m)   Wt 188 lb 4 oz (85.4 kg)   SpO2 96%   BMI 30.38 kg/m  General:  well developed, well nourished, in no apparent distress Heart: RRR, no bruits, no LE edema Lungs: clear to auscultation, no accessory muscle use Psych: well oriented with normal range of affect and appropriate judgment/insight  Mixed hyperlipidemia - Plan: fenofibrate (TRICOR) 145 MG tablet  Diabetes mellitus type 2 in obese ([facility])  Chronic bilateral low back pain, unspecified whether sciatica present  1. Cont statin. Counseled on diet and exercise. Start fibrate.  2. Cont diet controlled approach.  3. Stretches/exercises. If no improvement, PT.  F/u in 6 weeks. The patient voiced understanding and agreement to the plan.  [place], DO 01/21/20  4:11 PM

## 2020-02-02 ENCOUNTER — Telehealth: Payer: Self-pay | Admitting: Internal Medicine

## 2020-02-02 DIAGNOSIS — E1169 Type 2 diabetes mellitus with other specified complication: Secondary | ICD-10-CM

## 2020-02-02 MED ORDER — PIOGLITAZONE HCL 30 MG PO TABS: 30.0000 mg | ORAL_TABLET | Freq: Every day | ORAL | 0 refills | Status: DC

## 2020-02-02 NOTE — Telephone Encounter (Signed)
Patient called answering service,  he decided to check  his sugar this afternoon: 422. He recheck few minutes later and it was 265. Answering service reports that he is completely asymptomatic. Last A1c was 6.7, currently on diet control. Plan: Restart Actos 30 mg 1 p.o. daily  #30 tablets sent to the pharmacy Recommend to reach out to PCP tomorrow If symptoms such as nausea, dizziness, vomiting: ER tonight We will forward this note to PCP

## 2020-02-03 ENCOUNTER — Telehealth: Payer: Self-pay

## 2020-02-03 NOTE — Telephone Encounter (Signed)
Nurse Assessment Nurse: Raenette Rover, RN, Zella Ball Date/Time (Eastern Time): 02/02/2020 5:39:06 PM Confirm and document reason for call. If symptomatic, describe symptoms. ---Took himself off the Metformin because of weight gain and he felt bad while taking it. Now his glucose is 422. Denies any symptoms. Jeffery Farrell he came home from work and decided to test it. Has the patient had close contact with a person known or suspected to have the novel coronavirus illness OR traveled / lives in area with major community spread (including international travel) in the last 14 days from the onset of symptoms? * If Asymptomatic, screen for exposure and travel within the last 14 days. ---No Does the patient have any new or worsening symptoms? ---Yes Will a triage be completed? ---Yes Related visit to physician within the last 2 weeks? ---No Does the PT have any chronic conditions? (i.e. diabetes, asthma, this includes High risk factors for pregnancy, etc.) ---Yes List chronic conditions. ---diabetes, htn Is this a behavioral health or substance abuse call? ---No Guidelines Guideline Title Affirmed Question Affirmed Notes Nurse Date/Time (Eastern Time) Diabetes - High Blood Sugar [1] Blood glucose > 300 mg/dL (16.7 mmol/L) AND [2] two or more times in a row [place], RN, Zella Ball 02/02/2020 5:42:21 PM PLEASE NOTE: All timestamps contained within this report are represented as [place] Standard Time. CONFIDENTIALTY NOTICE: This fax transmission is intended only for the addressee. It contains information that is legally privileged, confidential or otherwise protected from use or disclosure. If you are not the intended recipient, you are strictly prohibited from reviewing, disclosing, copying using or disseminating any of this information or taking any action in reliance on or regarding this information. If you have received this fax in error, please notify us immediately by telephone so that we can arrange for its  return to [place]. Phone: [phone], Toll-Free: [phone], Fax: [phone] Page: 2 of 2 Call Id: [id number] [place]. Time Eilene Ghazi Time) Disposition Final User 02/02/2020 5:25:39 PM Attempt made - message left Lahoma Crocker 02/02/2020 5:51:25 PM Paged On Call back to [facility], [place], Zella Ball 02/02/2020 5:48:46 PM Call PCP Now Yes Raenette Rover, RN, Herbert Deaner Disagree/Comply Comply Caller Understands Yes PreDisposition Call Doctor Care Advice Given Per Guideline CALL PCP NOW: * I'll page the on-call provider now. If you haven't heard from the provider (or me) within 30 minutes, call again. CALL BACK IF: * Vomiting occurs * Rapid breathing occurs * You become worse. CARE ADVICE given per Diabetes - High Blood Sugar (Adult) guideline. Comments User: Wilson Singer, RN Date/Time Eilene Ghazi Time): 02/02/2020 5:42:57 PM rechecked and its 365 User: Wilson Singer, RN Date/Time Eilene Ghazi Time): 02/02/2020 6:17:10 PM Spoke with Dr Derry Skill said to advise patient not to take anymore Metformin. To start Actos 30 mg once tonight with supper and then one every morning. Call pcp tomorrow and schedule appt. Advised for any symptoms to go to the ER. Called patient back and advised. Paging DoctorName Phone DateTime Result/Outcome Message Type Notes Kathlene November - MD [phone] 02/02/2020 5:51:25 PM Paged On Call Back to Call Center Doctor Paged Hello this is Allie RN with [facility] and my caller is diabetic and quit taking his Metformin a month ago. He said it was causing him to gain weght. He jsut came home from work and randomly checked his Glucose and it was 422, then rechecked with me on the phone and its 365. My out come is to call pcp now for medication advisement. My cb [phone] Kathlene November - MD 02/02/2020 6:09:36 PM [employer]  Notification Received Message Result

## 2020-02-03 NOTE — Telephone Encounter (Signed)
Caller states he wanted to schedule an appointment. Caller states he wanted to discuss his medication and his blood blood sugar was elevated at 442 on yesterday. Additional Comment Caller declined triage and he wanted to see the doctor about his medications.  See telephone note by Dr. Larose Kells last night.

## 2020-02-03 NOTE — Telephone Encounter (Signed)
Spoke w/ Pt- he has not checked his blood sugars today- (he is at work and left his glucometer) but he did take 1 tablet of Actos last night and then again this morning. We scheduled an appt w/ Dr. Nani Ravens for tomorrow 02/04/20 to come in and discuss his medications.

## 2020-02-04 ENCOUNTER — Ambulatory Visit (INDEPENDENT_AMBULATORY_CARE_PROVIDER_SITE_OTHER): Payer: Managed Care, Other (non HMO) | Admitting: Family Medicine

## 2020-02-04 ENCOUNTER — Other Ambulatory Visit: Payer: Self-pay

## 2020-02-04 ENCOUNTER — Ambulatory Visit (HOSPITAL_BASED_OUTPATIENT_CLINIC_OR_DEPARTMENT_OTHER)
Admission: RE | Admit: 2020-02-04 | Discharge: 2020-02-04 | Disposition: A | Discharge status: Home / Self Care | Payer: Managed Care, Other (non HMO) | Source: Ambulatory Visit | Attending: Family Medicine | Admitting: Family Medicine

## 2020-02-04 ENCOUNTER — Encounter: Payer: Self-pay | Admitting: Family Medicine

## 2020-02-04 VITALS — BP 124/76 | HR 101 | Temp 98.3°F | Ht 66.0 in | Wt 182.1 lb

## 2020-02-04 DIAGNOSIS — E1169 Type 2 diabetes mellitus with other specified complication: Secondary | ICD-10-CM

## 2020-02-04 DIAGNOSIS — E669 Obesity, unspecified: Secondary | ICD-10-CM | POA: Diagnosis not present

## 2020-02-04 DIAGNOSIS — R140 Abdominal distension (gaseous): Secondary | ICD-10-CM | POA: Insufficient documentation

## 2020-02-04 DIAGNOSIS — R739 Hyperglycemia, unspecified: Secondary | ICD-10-CM

## 2020-02-04 MED ORDER — GLIMEPIRIDE 4 MG PO TABS: 4.0000 mg | ORAL_TABLET | Freq: Every day | ORAL | 3 refills | Status: DC

## 2020-02-04 MED ORDER — ALPRAZOLAM 0.5 MG PO TABS: 0.5000 mg | ORAL_TABLET | Freq: Every day | ORAL | 1 refills | Status: DC | PRN

## 2020-02-04 NOTE — Progress Notes (Signed)
Chief Complaint  Patient presents with   Follow-up    Subjective: Patient is a 59 y.o. male here for hyperglycemia.  Sugars have been running in 200-400's over past week or so. Was placed back on Actos and it came back to 200's. Compliant with Amaryl 2 mg/d.  Diet is fair and unchanged. Walking and active at work. He checked his sugar while in office and found it to be 271.   Has had abd distension as well. BM's normal. Diet unchanged. No fevers, N/V.    Past Medical History:  Diagnosis Date   Diabetes mellitus type 2 in obese (HCC)    Hyperlipidemia    Hypertension     Objective: BP 124/76 (BP Location: Right Arm, Patient Position: Sitting, Cuff Size: Normal)    Pulse 101    Temp 98.3 F (36.8 C) (Oral)    Ht 5\' 6"  (1.676 m)    Wt 182 lb 2 oz (82.6 kg)    SpO2 96%    BMI 29.40 kg/m  General: Awake, appears stated age HEENT: MMM, EOMi Heart: RRR, no murmurs Lungs: CTAB, no rales, wheezes or rhonchi. No accessory muscle use Abd: BS+, mild distension, no TTP, no masses or organomegaly Psych: Age appropriate judgment and insight, normal affect and mood  Assessment and Plan: Diabetes mellitus type 2 in obese (HCC) - Plan: Anti-islet cell antibody, Basic metabolic panel, glimepiride (AMARYL) 4 MG tablet  Hyperglycemia - Plan: Anti-islet cell antibody, Basic metabolic panel, glimepiride (AMARYL) 4 MG tablet  Abdominal distension - Plan: DG Abd 2 Views  1/2. Increase Amaryl to 4 mg/d. Ck above. Stop Actos 2/2 cost.  3. Ck XR. Suspect Actos could be causing fluid retention that is leading to distension.  The patient voiced understanding and agreement to the plan.  [place], DO 02/04/20  2:29 PM

## 2020-02-04 NOTE — Addendum Note (Signed)
Addended by: Caffie Pinto on: 02/04/2020 02:45 PM   Modules accepted: Orders

## 2020-02-04 NOTE — Patient Instructions (Signed)
Give [place] 2-3 business days to get the results of your labs back.   We will be in touch regarding your X-ray.   Keep the diet clean and stay active.  Continue checking your sugars.  Let us know if you need anything.

## 2020-02-05 LAB — BASIC METABOLIC PANEL
BUN: 13 mg/dL (ref 7–25)
CO2: 25 mmol/L (ref 20–32)
Calcium: 9.9 mg/dL (ref 8.6–10.3)
Chloride: 98 mmol/L (ref 98–110)
Creat: 1.04 mg/dL (ref 0.70–1.25)
Glucose, Bld: 285 mg/dL — ABNORMAL HIGH (ref 65–99)
Potassium: 4.1 mmol/L (ref 3.5–5.3)
Sodium: 135 mmol/L (ref 135–146)

## 2020-02-07 ENCOUNTER — Telehealth: Payer: Self-pay | Admitting: Family Medicine

## 2020-02-07 NOTE — Telephone Encounter (Signed)
Patient calling in reference to lab results  °

## 2020-02-07 NOTE — Telephone Encounter (Signed)
See result notes. 

## 2020-02-08 LAB — ANTI-ISLET CELL ANTIBODY: Islet Cell Ab: NEGATIVE

## 2020-02-14 ENCOUNTER — Other Ambulatory Visit: Payer: Self-pay | Admitting: Family Medicine

## 2020-03-10 ENCOUNTER — Encounter: Payer: Self-pay | Admitting: Family Medicine

## 2020-03-10 ENCOUNTER — Ambulatory Visit (INDEPENDENT_AMBULATORY_CARE_PROVIDER_SITE_OTHER): Payer: Managed Care, Other (non HMO) | Admitting: Family Medicine

## 2020-03-10 ENCOUNTER — Other Ambulatory Visit: Payer: Self-pay

## 2020-03-10 VITALS — BP 124/80 | HR 97 | Temp 98.2°F | Ht 66.0 in | Wt 179.5 lb

## 2020-03-10 DIAGNOSIS — E782 Mixed hyperlipidemia: Secondary | ICD-10-CM

## 2020-03-10 DIAGNOSIS — E1169 Type 2 diabetes mellitus with other specified complication: Secondary | ICD-10-CM | POA: Diagnosis not present

## 2020-03-10 DIAGNOSIS — E669 Obesity, unspecified: Secondary | ICD-10-CM

## 2020-03-10 DIAGNOSIS — R140 Abdominal distension (gaseous): Secondary | ICD-10-CM

## 2020-03-10 NOTE — Patient Instructions (Addendum)
Give [place] 2-3 business days to get the results of your labs back.   Keep the diet clean and stay active.  Stop chewing gum, drinking carbonated beverages, gulping liquids, and drinking alcohol to help with belching.  These foods may cause you to belch more: Wheat, barley, rye, onion, leek, white part of spring onion, garlic, shallots, artichokes, beetroot, fennel, peas, chicory, pistachio, cashews, legumes, lentils, and chickpeas; Milk, custard, ice cream, and yogurt; Apples, pears, mangoes, cherries, watermelon, asparagus, sugar snap peas, honey, high-fructose corn syrup; Apricots, nectarines, peaches, plums, mushrooms, cauliflower, artificially sweetened chewing gum and confectionery.  Do your stretches and exercises for your back.   I recommend getting the flu shot in mid October. This suggestion would change if the CDC comes out with a different recommendation.   Let us know if you need anything.

## 2020-03-10 NOTE — Progress Notes (Signed)
Subjective:   Chief Complaint  Patient presents with  . Follow-up    Jeffery Farrell is a 59 y.o. male here for follow-up of diabetes.   Jeffery Farrell does not routinely monitor his sugars.  Patient does not require insulin.   Medications include: Amaryl 4 mg/d Diet is healthy, has lost some weight.  Exercise: walking  Hyperlipidemia Patient presents for dyslipidemia follow up. Currently being treated with Lipitor 80 mg/d and compliance with treatment thus far has been good. He denies myalgias. Diet/exericse as above The patient is not known to have coexisting coronary artery disease.  Past Medical History:  Diagnosis Date  . Diabetes mellitus type 2 in obese ([place])   . Hyperlipidemia   . Hypertension      Related testing: Date of retinal exam: Not done, scheduled for next mo Pneumovax: done  Objective:  BP 124/80 (BP Location: Left Arm, Patient Position: Sitting, Cuff Size: Normal)   Pulse 97   Temp 98.2 F (36.8 C) (Oral)   Ht 5\' 6"  (1.676 m)   Wt 179 lb 8 oz (81.4 kg)   SpO2 96%   BMI 28.97 kg/m  General:  Well developed, well nourished, in no apparent distress Skin:  Warm, no pallor or diaphoresis Head:  Normocephalic, atraumatic Eyes:  Pupils equal and round, sclera anicteric without injection  Lungs:  CTAB, no access msc use Cardio:  RRR, no bruits, no LE edema Psych: Age appropriate judgment and insight  Assessment:   Diabetes mellitus type 2 in obese (HCC) - Plan: Hemoglobin A1c, Lipid panel  Mixed hyperlipidemia  Abdominal distension   Plan:   Counseled on diet and exercise. Ck labs. F/u pending results. Cost is large barrier. Could increase SU vs adding TZD in his case. Did not do well with metformin.  Eructation diet info provided. If no improvement, consider nutrition referral vs GI referral pending cost. The patient voiced understanding and agreement to the plan.  [place], DO 03/10/20 9:42 AM

## 2020-03-11 ENCOUNTER — Other Ambulatory Visit: Payer: Self-pay | Admitting: Family Medicine

## 2020-03-11 DIAGNOSIS — R739 Hyperglycemia, unspecified: Secondary | ICD-10-CM

## 2020-03-11 DIAGNOSIS — E1169 Type 2 diabetes mellitus with other specified complication: Secondary | ICD-10-CM

## 2020-03-11 LAB — LIPID PANEL
Cholesterol: 173 mg/dL (ref <200)
HDL: 31 mg/dL — ABNORMAL LOW (ref >=40)
Non-HDL Cholesterol (Calc): 142 mg/dL (calc) — ABNORMAL HIGH (ref <130)
Total CHOL/HDL Ratio: 5.6 (calc) — ABNORMAL HIGH (ref <5.0)
Triglycerides: 761 mg/dL — ABNORMAL HIGH (ref <150)

## 2020-03-11 LAB — HEMOGLOBIN A1C
Hgb A1c MFr Bld: 10.6 % of total Hgb — ABNORMAL HIGH (ref <5.7)
Mean Plasma Glucose: 258 (calc)
eAG (mmol/L): 14.3 (calc)

## 2020-03-11 MED ORDER — GLIMEPIRIDE 4 MG PO TABS: 4.0000 mg | ORAL_TABLET | Freq: Two times a day (BID) | ORAL | 3 refills | Status: DC

## 2020-03-15 ENCOUNTER — Encounter: Payer: Self-pay | Admitting: Family Medicine

## 2020-03-15 ENCOUNTER — Other Ambulatory Visit: Payer: Self-pay

## 2020-03-15 ENCOUNTER — Ambulatory Visit (INDEPENDENT_AMBULATORY_CARE_PROVIDER_SITE_OTHER): Payer: Managed Care, Other (non HMO) | Admitting: Family Medicine

## 2020-03-15 VITALS — BP 134/76 | HR 78 | Temp 98.5°F | Ht 66.0 in | Wt 178.0 lb

## 2020-03-15 DIAGNOSIS — E1169 Type 2 diabetes mellitus with other specified complication: Secondary | ICD-10-CM

## 2020-03-15 DIAGNOSIS — E669 Obesity, unspecified: Secondary | ICD-10-CM

## 2020-03-15 DIAGNOSIS — E782 Mixed hyperlipidemia: Secondary | ICD-10-CM

## 2020-03-15 MED ORDER — FENOFIBRATE 145 MG PO TABS: 145.0000 mg | ORAL_TABLET | Freq: Every day | ORAL | 2 refills | Status: DC

## 2020-03-15 NOTE — Patient Instructions (Addendum)
If you have any questions regarding what medicines you should be taking, please reference your list on this after visit summary.   Keep the diet clean and stay active.  Let us know if you need anything.  Atorvastatin: Cholesterol medicine, decreases risk of heart attack and stroke  Tricor/fenofibrate: Cholesterol medicine  Amaryl/glimepiride: Diabetes  Omeprazole: reflux (stomach)  Levothyroxine: Thyroid medication

## 2020-03-15 NOTE — Progress Notes (Signed)
Chief Complaint  Patient presents with  . Follow-up    medication    Subjective: Patient is a 59 y.o. male here for med review.  Pt found to have elevated cholesterol and A1c most recently. It was unclear what medications he is on so we are here to sort that out.   He knows about taking Amaryl, now 4 mg twice daily for his sugars. He has not tolerated metformin very well.   He also takes his Xanax as needed in addition to his thyroid medication routinely.   He is compliant with his Lipitor 80 mg/d. He was rx'd Trico 145 mg/d, but seemed to be unaware of this.   Patient takes omeprazole for reflux.  He reports compliance.  Past Medical History:  Diagnosis Date  . Diabetes mellitus type 2 in obese ([place])   . Hyperlipidemia   . Hypertension     Objective: BP 134/76 (BP Location: Left Arm, Patient Position: Sitting, Cuff Size: Normal)   Pulse 78   Temp 98.5 F (36.9 C) (Oral)   Ht 5\' 6"  (1.676 m)   Wt 178 lb (80.7 kg)   SpO2 98%   BMI 28.73 kg/m  General: Awake, appears stated age Heart: RRR Lungs: No accessory muscle use Psych: Age appropriate judgment and insight, normal affect and mood  Assessment and Plan: Diabetes mellitus type 2 in obese (HCC)  Mixed hyperlipidemia - Plan: fenofibrate (TRICOR) 145 MG tablet  We reviewed each of his medications.  I wrote him out on his paperwork and put why he is taking each 1.  I resent the TriCor.  He is already scheduled to be seen in 1 month.  We will recheck his triglycerides at that time.  We will also review his home glucose readings and determine if we need to make any changes.  Cost has been a barrier. The patient voiced understanding and agreement to the plan.  [place], DO 03/15/20  2:28 PM

## 2020-03-15 NOTE — Progress Notes (Signed)
8

## 2020-04-14 ENCOUNTER — Ambulatory Visit (INDEPENDENT_AMBULATORY_CARE_PROVIDER_SITE_OTHER): Payer: Managed Care, Other (non HMO) | Admitting: Family Medicine

## 2020-04-14 ENCOUNTER — Other Ambulatory Visit: Payer: Self-pay

## 2020-04-14 ENCOUNTER — Encounter: Payer: Self-pay | Admitting: Family Medicine

## 2020-04-14 VITALS — BP 132/82 | HR 99 | Temp 98.1°F | Ht 65.0 in | Wt 179.0 lb

## 2020-04-14 DIAGNOSIS — Z0000 Encounter for general adult medical examination without abnormal findings: Secondary | ICD-10-CM

## 2020-04-14 DIAGNOSIS — Z125 Encounter for screening for malignant neoplasm of prostate: Secondary | ICD-10-CM | POA: Diagnosis not present

## 2020-04-14 DIAGNOSIS — E1169 Type 2 diabetes mellitus with other specified complication: Secondary | ICD-10-CM

## 2020-04-14 DIAGNOSIS — Z1211 Encounter for screening for malignant neoplasm of colon: Secondary | ICD-10-CM

## 2020-04-14 DIAGNOSIS — E669 Obesity, unspecified: Secondary | ICD-10-CM

## 2020-04-14 DIAGNOSIS — Z23 Encounter for immunization: Secondary | ICD-10-CM | POA: Diagnosis not present

## 2020-04-14 LAB — COMPREHENSIVE METABOLIC PANEL
AG Ratio: 1.7 (calc) (ref 1.0–2.5)
ALT: 37 U/L (ref 9–46)
AST: 25 U/L (ref 10–35)
Albumin: 4.4 g/dL (ref 3.6–5.1)
Alkaline phosphatase (APISO): 72 U/L (ref 35–144)
BUN: 12 mg/dL (ref 7–25)
CO2: 25 mmol/L (ref 20–32)
Calcium: 9.7 mg/dL (ref 8.6–10.3)
Chloride: 100 mmol/L (ref 98–110)
Creat: 1 mg/dL (ref 0.70–1.25)
Globulin: 2.6 g/dL (calc) (ref 1.9–3.7)
Glucose, Bld: 257 mg/dL — ABNORMAL HIGH (ref 65–99)
Potassium: 4.5 mmol/L (ref 3.5–5.3)
Sodium: 136 mmol/L (ref 135–146)
Total Bilirubin: 0.7 mg/dL (ref 0.2–1.2)
Total Protein: 7 g/dL (ref 6.1–8.1)

## 2020-04-14 LAB — LIPID PANEL
Cholesterol: 174 mg/dL (ref <200)
HDL: 30 mg/dL — ABNORMAL LOW (ref >=40)
Non-HDL Cholesterol (Calc): 144 mg/dL (calc) — ABNORMAL HIGH (ref <130)
Total CHOL/HDL Ratio: 5.8 (calc) — ABNORMAL HIGH (ref <5.0)
Triglycerides: 543 mg/dL — ABNORMAL HIGH (ref <150)

## 2020-04-14 LAB — PSA: PSA: 0.17 ng/mL (ref <=4.0)

## 2020-04-14 NOTE — Patient Instructions (Addendum)
Give [place] 2-3 business days to get the results of your labs back.   If you do not hear anything about your referral in the next 1-2 weeks, call our office and ask for an update.  Have your covid-19 vaccine card near your phone on Monday.  Keep the diet clean and stay active.  Aim to do some physical exertion for 150 minutes per week. This is typically divided into 5 days per week, 30 minutes per day. The activity should be enough to get your heart rate up. Anything is better than nothing if you have time constraints. Consider adding weight resistance exercise to your regimen.   The new Shingrix vaccine (for shingles) is a 2 shot series. It can make people feel low energy, achy and almost like they have the flu for 48 hours after injection. Please plan accordingly when deciding on when to get this shot. Call our office for a nurse visit appointment to get this. The second shot of the series is less severe regarding the side effects, but it still lasts 48 hours.   Call your insurance company to see if they cover irritated/painful skin tag removal. If they do, we can do that here.   Let us know if you need anything.

## 2020-04-14 NOTE — Progress Notes (Signed)
CC: Well visit  Well Male Jeffery Farrell is here for a complete physical.   His last physical was >1 year ago.  Current diet: in general, a "healthy" diet.  Current exercise: walking Weight trend: stable; he is frustrated with lack of weight loss Fatigue out of ordinary? No. Seat belt? Yes.    Health maintenance Shingrix- No Colonoscopy- Due Tetanus- No  HIV- Yes Hep C- Yes   Past Medical History:  Diagnosis Date  . Diabetes mellitus type 2 in obese ([place])   . Hyperlipidemia   . Hypertension      History reviewed. No pertinent surgical history.  Medications  Current Outpatient Medications on File Prior to Visit  Medication Sig Dispense Refill  . ALPRAZolam (XANAX) 0.5 MG tablet Take 1 tablet (0.5 mg total) by mouth daily as needed for anxiety. 90 tablet 1  . atorvastatin (LIPITOR) 80 MG tablet Take 1 tablet (80 mg total) by mouth daily. 30 tablet 5  . fenofibrate (TRICOR) 145 MG tablet Take 1 tablet (145 mg total) by mouth daily. 90 tablet 2  . glimepiride (AMARYL) 4 MG tablet Take 1 tablet (4 mg total) by mouth 2 (two) times daily. 60 tablet 3  . levothyroxine (SYNTHROID) 150 MCG tablet TAKE 1 TABLET BY MOUTH EVERY DAY AS DIRECTED 30 tablet 4  . omeprazole (PRILOSEC) 40 MG capsule TAKE 1 CAPSULE BY MOUTH 2 TIMES DAILY FOR ACID REFLUX 60 capsule 3    Allergies Allergies  Allergen Reactions  . Metformin Other (See Comments)    GI issues GI issues   . Indomethacin Other (See Comments)    GI issues     Family History History reviewed. No pertinent family history.  Review of Systems: Constitutional:  no fevers Eye:  no recent significant change in vision Ear/Nose/Mouth/Throat:  Ears:  no hearing loss Nose/Mouth/Throat:  no complaints of nasal congestion, no sore throat Cardiovascular:  no chest pain Respiratory:  no shortness of breath Gastrointestinal:  no change in bowel habits GU:  Male: negative for dysuria, frequency Musculoskeletal/Extremities:  no joint  pain Integumentary (Skin/Breast):  no abnormal skin lesions reported Neurologic:  no headaches Endocrine: No unexpected weight changes Hematologic/Lymphatic:  no abnormal bleeding  Exam BP 132/82 (BP Location: Left Arm, Patient Position: Sitting, Cuff Size: Normal)   Pulse 99   Temp 98.1 F (36.7 C) (Oral)   Ht 5\' 5"  (1.651 m)   Wt 179 lb (81.2 kg)   SpO2 99%   BMI 29.79 kg/m  General:  well developed, well nourished, in no apparent distress Skin:  no significant moles, warts, or growths Head:  no masses, lesions, or tenderness Eyes:  pupils equal and round, sclera anicteric without injection Ears:  canals without lesions, TMs shiny without retraction, no obvious effusion, no erythema Nose:  nares patent, septum midline, mucosa normal Throat/Pharynx:  lips and gingiva without lesion; tongue and uvula midline; non-inflamed pharynx; no exudates or postnasal drainage Neck: neck supple without adenopathy, thyromegaly, or masses Cardiac: RRR, no bruits, no LE edema Lungs:  clear to auscultation, breath sounds equal bilaterally, no respiratory distress Rectal: Deferred Musculoskeletal:  symmetrical muscle groups noted without atrophy or deformity Neuro:  gait normal; deep tendon reflexes normal and symmetric Psych: well oriented with normal range of affect and appropriate judgment/insight  Assessment and Plan  Well adult exam  Diabetes mellitus type 2 in obese ([place]) - Plan: Comprehensive metabolic panel, Lipid panel, Microalbumin / creatinine urine ratio  Screen for colon cancer - Plan: Ambulatory referral to  Gastroenterology  Screening for prostate cancer - Plan: PSA   Well 59 y.o. male. Counseled on diet and exercise. Counseled on risks and benefits of prostate cancer screening with PSA. The patient agrees to undergo testing. Immunizations, labs, and further orders as above. Eye exam is coming up. He did get covid vaccine, has card at home. Will keep it nearby [facility] for when  we call w results.  Follow up in 2 mo for DM visit, will ck a1c. The patient voiced understanding and agreement to the plan.  [place], DO 04/14/20 7:53 AM

## 2020-04-15 ENCOUNTER — Other Ambulatory Visit: Payer: Self-pay | Admitting: Family Medicine

## 2020-04-15 MED ORDER — ICOSAPENT ETHYL 1 G PO CAPS: 2.0000 g | ORAL_CAPSULE | Freq: Two times a day (BID) | ORAL | 5 refills | Status: DC

## 2020-06-09 ENCOUNTER — Other Ambulatory Visit: Payer: Self-pay | Admitting: Family Medicine

## 2020-06-16 ENCOUNTER — Encounter: Payer: Self-pay | Admitting: Family Medicine

## 2020-06-16 ENCOUNTER — Other Ambulatory Visit: Payer: Self-pay

## 2020-06-16 ENCOUNTER — Ambulatory Visit (INDEPENDENT_AMBULATORY_CARE_PROVIDER_SITE_OTHER): Payer: Managed Care, Other (non HMO) | Admitting: Family Medicine

## 2020-06-16 VITALS — BP 118/70 | HR 95 | Temp 98.2°F | Ht 65.0 in | Wt 178.2 lb

## 2020-06-16 DIAGNOSIS — E782 Mixed hyperlipidemia: Secondary | ICD-10-CM | POA: Diagnosis not present

## 2020-06-16 DIAGNOSIS — E669 Obesity, unspecified: Secondary | ICD-10-CM

## 2020-06-16 DIAGNOSIS — E1169 Type 2 diabetes mellitus with other specified complication: Secondary | ICD-10-CM | POA: Diagnosis not present

## 2020-06-16 LAB — LDL CHOLESTEROL, DIRECT: Direct LDL: 48 mg/dL

## 2020-06-16 LAB — LIPID PANEL
Cholesterol: 159 mg/dL (ref 0–200)
HDL: 26 mg/dL — ABNORMAL LOW (ref >39.00)
Total CHOL/HDL Ratio: 6
Triglycerides: 609 mg/dL — ABNORMAL HIGH (ref 0.0–149.0)

## 2020-06-16 LAB — HEMOGLOBIN A1C: Hgb A1c MFr Bld: 11.2 % — ABNORMAL HIGH (ref 4.6–6.5)

## 2020-06-16 NOTE — Progress Notes (Signed)
Subjective:   Chief Complaint  Patient presents with  . Follow-up    Jeffery Farrell is a 59 y.o. male here for follow-up of diabetes.   [place] has not been checking  Patient does not require insulin.   Medications include: Amaryl 4 mg bid; did not work well with metformin, does not remember if he has been  Diet is healthy.  Exercise: walking  Hyperlipidemia Patient presents for dyslipidemia follow up. Currently being treated with Lipitor 80 mg/d, Tricor 145 mg/d, Vascepa and compliance with treatment thus far has been good. He complains of myalgias. Diet/exercise as above.  The patient is not known to have coexisting coronary artery disease.  Past Medical History:  Diagnosis Date  . Diabetes mellitus type 2 in obese ([place])   . Hyperlipidemia   . Hypertension      Related testing: Retinal exam: Due, appt in Jan Pneumovax: done  Objective:  BP 118/70 (BP Location: Left Arm, Patient Position: Sitting, Cuff Size: Normal)   Pulse 95   Temp 98.2 F (36.8 C) (Oral)   Ht 5\' 5"  (1.651 m)   Wt 178 lb 4 oz (80.9 kg)   SpO2 98%   BMI 29.66 kg/m  General:  Well developed, well nourished, in no apparent distress Eyes:  Pupils equal and round, sclera anicteric without injection  Lungs:  CTAB, no access msc use Cardio:  RRR, no bruits, no LE edema Psych: Age appropriate judgment and insight  Assessment:   Diabetes mellitus type 2 in obese ([place]) - Plan: Microalbumin / creatinine urine ratio, Hemoglobin A1c  Mixed hyperlipidemia - Plan: Lipid panel   Plan:   1. Cont Amaryl 4 mg bid. Cost has been an issue to getting quality control of sugars. Would consider metformin XR if not controlled. Counseled on diet and exercise. Monitor sugars at home. 2. Cont Tricor 145 mg/d, Lipitor 80 mg/d, Vascepa 2 mg bid. If still elevated, add Zetia. If still not controlled, refer to lipid clinic.  F/u in 3-6 mo pending results. The patient voiced understanding and agreement to the plan.  [place], DO 06/16/20 7:36 AM

## 2020-06-16 NOTE — Patient Instructions (Signed)
Give [place] 2-3 business days to get the results of your labs back.   Keep the diet clean and stay active.  I want your A1c less than 7.5.  We are going to try a long acting metformin if not where we want to be. This is generally easier on your stomach.  Let us know if you need anything.

## 2020-06-19 ENCOUNTER — Other Ambulatory Visit: Payer: Self-pay

## 2020-06-19 ENCOUNTER — Encounter: Payer: Self-pay | Admitting: Family Medicine

## 2020-06-19 ENCOUNTER — Other Ambulatory Visit: Payer: Managed Care, Other (non HMO)

## 2020-06-19 ENCOUNTER — Ambulatory Visit (INDEPENDENT_AMBULATORY_CARE_PROVIDER_SITE_OTHER): Payer: Managed Care, Other (non HMO) | Admitting: Family Medicine

## 2020-06-19 VITALS — BP 122/82 | HR 54 | Temp 97.9°F | Ht 65.0 in | Wt 181.5 lb

## 2020-06-19 DIAGNOSIS — E669 Obesity, unspecified: Secondary | ICD-10-CM

## 2020-06-19 DIAGNOSIS — E1169 Type 2 diabetes mellitus with other specified complication: Secondary | ICD-10-CM | POA: Diagnosis not present

## 2020-06-19 LAB — MICROALBUMIN / CREATININE URINE RATIO
Creatinine,U: 42.8 mg/dL
Microalb Creat Ratio: 3.9 mg/g (ref 0.0–30.0)
Microalb, Ur: 1.7 mg/dL (ref 0.0–1.9)

## 2020-06-19 MED ORDER — METFORMIN HCL ER 500 MG PO TB24
ORAL_TABLET | ORAL | 1 refills | Status: DC
Start: 2020-06-19 — End: 2020-07-14

## 2020-06-19 NOTE — Progress Notes (Signed)
Chief Complaint  Patient presents with  . Results    Subjective: Patient is a 59 y.o. male here for f/u DM.  Seen 3 d ago and A1c was 11.2. he was well controlled 7 months ago. Reports compliance with Amaryl 4 mg bid. Diet has been fair, he walks routinely. Has not been monitoring sugars routinely.    Past Medical History:  Diagnosis Date  . Diabetes mellitus type 2 in obese ([place])   . Hyperlipidemia   . Hypertension     Objective: BP 122/82 (BP Location: Left Arm, Patient Position: Sitting, Cuff Size: Normal)   Pulse (!) 54   Temp 97.9 F (36.6 C) (Oral)   Ht 5\' 5"  (1.651 m)   Wt 181 lb 8 oz (82.3 kg)   SpO2 96%   BMI 30.20 kg/m  General: Awake, appears stated age Lungs:  No accessory muscle use Psych: Age appropriate judgment and insight, normal affect and mood  Assessment and Plan: Diabetes mellitus type 2 in obese ([place]) - Plan: metFORMIN (GLUCOPHAGE-XR) 500 MG 24 hr tablet, Microalbumin / creatinine urine ratio  Status: Uncontrolled. Start metformin XR 500 mg/d for 1 week and then 1000 mg qd. Ck urine today as he was not able to get [place] a sample last week. I am very concerned his pancreas is not making insulin. He is adamant that he does not want to go on insulin if possible. I will restart metformin, LA this time, and have him check his sugars 4 times weekly. We will reconvene in 3-4 weeks and review his readings. If no change/improvement, I will start Levemir. We will recheck his lipids at the next visit as well.  The patient voiced understanding and agreement to the plan.  [place], DO 06/19/20  2:22 PM

## 2020-06-19 NOTE — Patient Instructions (Signed)
Check you sugars 4 times weekly.   Keep the diet clean and stay active.  Continue the Amaryl.   I fear that we may need to go on insulin, but that will be clear in the coming weeks.   Consider dietary advice from the American Diabetic Association website also.   Healthy Eating Plan Many factors influence your heart health, including eating and exercise habits. Heart (coronary) risk increases with abnormal blood fat (lipid) levels. Heart-healthy meal planning includes limiting unhealthy fats, increasing healthy fats, and making other small dietary changes. This includes maintaining a healthy body weight to help keep lipid levels within a normal range.  WHAT IS MY PLAN?  Your health care provider recommends that you:  Drink a glass of water before meals to help with satiety.  Eat slowly.  An alternative to the water is to add Metamucil. This will help with satiety as well. It does contain calories, unlike water.  WHAT TYPES OF FAT SHOULD I CHOOSE?  Choose healthy fats more often. Choose monounsaturated and polyunsaturated fats, such as olive oil and canola oil, flaxseeds, walnuts, almonds, and seeds.  Eat more omega-3 fats. Good choices include salmon, mackerel, sardines, tuna, flaxseed oil, and ground flaxseeds. Aim to eat fish at least two times each week.  Avoid foods with partially hydrogenated oils in them. These contain trans fats. Examples of foods that contain trans fats are stick margarine, some tub margarines, cookies, crackers, and other baked goods. If you are going to avoid a fat, this is the one to avoid!  WHAT GENERAL GUIDELINES DO I NEED TO FOLLOW?  Check food labels carefully to identify foods with trans fats. Avoid these types of options when possible.  Fill one half of your plate with vegetables and green salads. Eat 4-5 servings of vegetables per day. A serving of vegetables equals 1 cup of raw leafy vegetables,  cup of raw or cooked cut-up vegetables, or  cup of  vegetable juice.  Fill one fourth of your plate with whole grains. Look for the word "whole" as the first word in the ingredient list.  Fill one fourth of your plate with lean protein foods.  Eat 4-5 servings of fruit per day. A serving of fruit equals one medium whole fruit,  cup of dried fruit,  cup of fresh, frozen, or canned fruit. Try to avoid fruits in cups/syrups as the sugar content can be high.  Eat more foods that contain soluble fiber. Examples of foods that contain this type of fiber are apples, broccoli, carrots, beans, peas, and barley. Aim to get 20-30 g of fiber per day.  Eat more home-cooked food and less restaurant, buffet, and fast food.  Limit or avoid alcohol.  Limit foods that are high in starch and sugar.  Avoid fried foods when able.  Cook foods by using methods other than frying. Baking, boiling, grilling, and broiling are all great options. Other fat-reducing suggestions include: ? Removing the skin from poultry. ? Removing all visible fats from meats. ? Skimming the fat off of stews, soups, and gravies before serving them. ? Steaming vegetables in water or broth.  Lose weight if you are overweight. Losing just 5-10% of your initial body weight can help your overall health and prevent diseases such as diabetes and heart disease.  Increase your consumption of nuts, legumes, and seeds to 4-5 servings per week. One serving of dried beans or legumes equals  cup after being cooked, one serving of nuts equals 1 ounces, and  one serving of seeds equals  ounce or 1 tablespoon.  WHAT ARE GOOD FOODS CAN I EAT? Grains Grainy breads (try to find bread that is 3 g of fiber per slice or greater), oatmeal, light popcorn. Whole-grain cereals. Rice and pasta, including brown rice and those that are made with whole wheat. Edamame pasta is a great alternative to grain pasta. It has a higher protein content. Try to avoid significant consumption of white bread, sugary cereals,  or pastries/baked goods.  Vegetables All vegetables. Cooked white potatoes do not count as vegetables.  Fruits All fruits, but limit pineapple and bananas as these fruits have a higher sugar content.  Meats and Other Protein Sources Lean, well-trimmed beef, veal, pork, and lamb. Chicken and [place] without skin. All fish and shellfish. Wild duck, rabbit, pheasant, and venison. Egg whites or low-cholesterol egg substitutes. Dried beans, peas, lentils, and tofu.Seeds and most nuts.  Dairy Low-fat or nonfat cheeses, including ricotta, string, and mozzarella. Skim or 1% milk that is liquid, powdered, or evaporated. Buttermilk that is made with low-fat milk. Nonfat or low-fat yogurt. Soy/Almond milk are good alternatives if you cannot handle dairy.  Beverages Water is the best for you. Sports drinks with less sugar are more desirable unless you are a highly active athlete.  Sweets and Desserts Sherbets and fruit ices. Honey, jam, marmalade, jelly, and syrups. Dark chocolate.  Eat all sweets and desserts in moderation.  Fats and Oils Nonhydrogenated (trans-free) margarines. Vegetable oils, including soybean, sesame, sunflower, olive, peanut, safflower, corn, canola, and cottonseed. Salad dressings or mayonnaise that are made with a vegetable oil. Limit added fats and oils that you use for cooking, baking, salads, and as spreads.  Other Cocoa powder. Coffee and tea. Most condiments.  The items listed above may not be a complete list of recommended foods or beverages. Contact your dietitian for more options.

## 2020-06-26 ENCOUNTER — Other Ambulatory Visit: Payer: Self-pay | Admitting: Family Medicine

## 2020-07-14 ENCOUNTER — Ambulatory Visit: Payer: Managed Care, Other (non HMO) | Admitting: Family Medicine

## 2020-07-14 ENCOUNTER — Other Ambulatory Visit: Payer: Self-pay | Admitting: Family Medicine

## 2020-07-14 DIAGNOSIS — E1169 Type 2 diabetes mellitus with other specified complication: Secondary | ICD-10-CM

## 2020-07-27 ENCOUNTER — Other Ambulatory Visit: Payer: Self-pay | Admitting: Family Medicine

## 2020-07-27 DIAGNOSIS — E1169 Type 2 diabetes mellitus with other specified complication: Secondary | ICD-10-CM

## 2020-07-27 DIAGNOSIS — E669 Obesity, unspecified: Secondary | ICD-10-CM

## 2020-07-27 DIAGNOSIS — R739 Hyperglycemia, unspecified: Secondary | ICD-10-CM

## 2020-08-18 ENCOUNTER — Other Ambulatory Visit: Payer: Self-pay | Admitting: Family Medicine

## 2020-10-10 ENCOUNTER — Other Ambulatory Visit: Payer: Self-pay | Admitting: Family Medicine

## 2020-10-10 DIAGNOSIS — R739 Hyperglycemia, unspecified: Secondary | ICD-10-CM

## 2020-10-10 DIAGNOSIS — E669 Obesity, unspecified: Secondary | ICD-10-CM

## 2020-10-10 DIAGNOSIS — E1169 Type 2 diabetes mellitus with other specified complication: Secondary | ICD-10-CM

## 2020-11-03 ENCOUNTER — Other Ambulatory Visit: Payer: Self-pay | Admitting: Family Medicine

## 2020-11-11 ENCOUNTER — Other Ambulatory Visit: Payer: Self-pay | Admitting: Family Medicine

## 2020-11-11 DIAGNOSIS — R739 Hyperglycemia, unspecified: Secondary | ICD-10-CM

## 2020-11-11 DIAGNOSIS — E1169 Type 2 diabetes mellitus with other specified complication: Secondary | ICD-10-CM

## 2020-11-24 ENCOUNTER — Other Ambulatory Visit: Payer: Self-pay | Admitting: Family Medicine

## 2020-11-24 NOTE — Telephone Encounter (Signed)
Requesting: xanax Contract: n/a UDS:n/a Last Visit:06/2020 Next Visit:12/08/2020 Last Refill:01/2020  Please Advise

## 2020-12-02 ENCOUNTER — Other Ambulatory Visit: Payer: Self-pay | Admitting: Family Medicine

## 2020-12-08 ENCOUNTER — Other Ambulatory Visit: Payer: Self-pay

## 2020-12-08 ENCOUNTER — Encounter: Payer: Self-pay | Admitting: Family Medicine

## 2020-12-08 ENCOUNTER — Ambulatory Visit (INDEPENDENT_AMBULATORY_CARE_PROVIDER_SITE_OTHER): Payer: Managed Care, Other (non HMO) | Admitting: Family Medicine

## 2020-12-08 VITALS — BP 120/82 | HR 83 | Temp 98.3°F | Ht 65.0 in | Wt 171.0 lb

## 2020-12-08 DIAGNOSIS — E1169 Type 2 diabetes mellitus with other specified complication: Secondary | ICD-10-CM

## 2020-12-08 DIAGNOSIS — E669 Obesity, unspecified: Secondary | ICD-10-CM

## 2020-12-08 DIAGNOSIS — E782 Mixed hyperlipidemia: Secondary | ICD-10-CM

## 2020-12-08 DIAGNOSIS — Z1211 Encounter for screening for malignant neoplasm of colon: Secondary | ICD-10-CM

## 2020-12-08 DIAGNOSIS — E039 Hypothyroidism, unspecified: Secondary | ICD-10-CM | POA: Diagnosis not present

## 2020-12-08 LAB — T4, FREE: Free T4: 1.14 ng/dL (ref 0.60–1.60)

## 2020-12-08 LAB — TSH: TSH: 0.58 u[IU]/mL (ref 0.35–4.50)

## 2020-12-08 NOTE — Progress Notes (Signed)
Chief Complaint  Patient presents with  . Follow-up    Subjective Jeffery Farrell is a 60 y.o. male who presents for hypertension follow up.  Hypothyroidism Patient presents for follow-up of hypothyroidism.  Reports compliance with medication. Current symptoms include: denies fatigue, weight changes, heat/cold intolerance, bowel/skin changes or CVS symptoms He believes his dose should be not significantly changed  Hyperlipidemia Patient presents for dyslipidemia follow up. Currently being treated with Lipitor 80 mg/d, Zetia 10 mg/d, Tricor 145 mg/d and compliance with treatment thus far has been good. He denies myalgias. Diet/exercise as above.  The patient is not known to have coexisting coronary artery disease.   Past Medical History:  Diagnosis Date  . Diabetes mellitus type 2 in obese ([place])   . Hyperlipidemia   . Hypertension     Exam BP 120/82 (BP Location: Left Arm, Patient Position: Sitting, Cuff Size: Normal)   Pulse 83   Temp 98.3 F (36.8 C) (Oral)   Ht 5\' 5"  (1.651 m)   Wt 171 lb (77.6 kg)   SpO2 98%   BMI 28.46 kg/m  General:  well developed, well nourished, in no apparent distress Heart: RRR, no bruits, no LE edema Lungs: clear to auscultation, no accessory muscle use Psych: well oriented with normal range of affect and appropriate judgment/insight  Mixed hyperlipidemia - Plan: Lipid panel, Comprehensive metabolic panel  Hypothyroidism, unspecified type - Plan: TSH, T4, free  Diabetes mellitus type 2 in obese [facility])  Screen for colon cancer - Plan: Ambulatory referral to Gastroenterology  1. Chronic, controlled. Cont Synthroid 150 mcg/d. Ck labs. 2. Chronic, controlled. Cont Lipitor 80 mg/d, Zetia 10 mg/d, Tricor 145 mg/d Counseled on diet and exercise. F/u in 6 mo or prn. The patient voiced understanding and agreement to the plan.  [place], DO 12/08/20  9:55 AM

## 2020-12-08 NOTE — Patient Instructions (Addendum)
Give [place] 2-3 business days to get the results of your labs back.   Keep the diet clean and stay active.  Strong work with your weight loss. Keep up the good work.   If you do not hear anything about your referral in the next 1-2 weeks, call our office and ask for an update.  Let us know if you need anything.

## 2020-12-11 ENCOUNTER — Other Ambulatory Visit: Payer: Self-pay | Admitting: Family Medicine

## 2020-12-11 DIAGNOSIS — E782 Mixed hyperlipidemia: Secondary | ICD-10-CM

## 2020-12-11 LAB — LIPID PANEL
Cholesterol: 138 mg/dL (ref 0–200)
HDL: 34.6 mg/dL — ABNORMAL LOW (ref >39.00)
LDL Cholesterol: 77 mg/dL (ref 0–99)
NonHDL: 103.8
Total CHOL/HDL Ratio: 4
Triglycerides: 134 mg/dL (ref 0.0–149.0)
VLDL: 26.8 mg/dL (ref 0.0–40.0)

## 2020-12-11 LAB — COMPREHENSIVE METABOLIC PANEL
ALT: 46 U/L (ref 0–53)
AST: 37 U/L (ref 0–37)
Albumin: 4.6 g/dL (ref 3.5–5.2)
Alkaline Phosphatase: 44 U/L (ref 39–117)
BUN: 9 mg/dL (ref 6–23)
CO2: 26 mEq/L (ref 19–32)
Calcium: 10 mg/dL (ref 8.4–10.5)
Chloride: 101 mEq/L (ref 96–112)
Creatinine, Ser: 1.04 mg/dL (ref 0.40–1.50)
GFR: 77.56 mL/min (ref >60.00)
Glucose, Bld: 118 mg/dL — ABNORMAL HIGH (ref 70–99)
Potassium: 4.4 mEq/L (ref 3.5–5.1)
Sodium: 138 mEq/L (ref 135–145)
Total Bilirubin: 0.7 mg/dL (ref 0.2–1.2)
Total Protein: 7.3 g/dL (ref 6.0–8.3)

## 2020-12-11 MED ORDER — METFORMIN HCL 1000 MG PO TABS: 1000.0000 mg | ORAL_TABLET | Freq: Two times a day (BID) | ORAL | 3 refills | Status: DC

## 2020-12-12 ENCOUNTER — Other Ambulatory Visit: Payer: Self-pay | Admitting: Family Medicine

## 2020-12-12 NOTE — Telephone Encounter (Signed)
PCP to start the patient on Rybelsus 3 mg daily. Patient given #3 boxes #30 count in each box, total of #90. Patient will pickup at the front desk.

## 2020-12-13 ENCOUNTER — Other Ambulatory Visit: Payer: Self-pay | Admitting: Family Medicine

## 2020-12-20 ENCOUNTER — Other Ambulatory Visit: Payer: Self-pay | Admitting: Family Medicine

## 2020-12-20 DIAGNOSIS — E1169 Type 2 diabetes mellitus with other specified complication: Secondary | ICD-10-CM

## 2020-12-20 DIAGNOSIS — R739 Hyperglycemia, unspecified: Secondary | ICD-10-CM

## 2021-01-25 ENCOUNTER — Other Ambulatory Visit: Payer: Self-pay | Admitting: Family Medicine

## 2021-01-25 DIAGNOSIS — E1169 Type 2 diabetes mellitus with other specified complication: Secondary | ICD-10-CM

## 2021-01-25 DIAGNOSIS — R739 Hyperglycemia, unspecified: Secondary | ICD-10-CM

## 2021-02-24 ENCOUNTER — Other Ambulatory Visit: Payer: Self-pay | Admitting: Family Medicine

## 2021-03-19 ENCOUNTER — Telehealth: Payer: Self-pay | Admitting: Family Medicine

## 2021-03-19 NOTE — Telephone Encounter (Signed)
Patient came in the office requesting samples of Rybelsus 3 mg. [place] Per PCP to give #5 boxes Rybelsus 3 mg #30 per box = total #150. Patient is aware and will pickup at our office on 03/20/2021.

## 2021-03-19 NOTE — Telephone Encounter (Signed)
See telephone note dated today 03/19/2021 regarding these samples.

## 2021-03-19 NOTE — Telephone Encounter (Signed)
Patent came in requesting  Rybelsus   Pharm: [facility]   Or if we can do any samples?

## 2021-03-21 ENCOUNTER — Encounter: Payer: Self-pay | Admitting: Gastroenterology

## 2021-03-21 ENCOUNTER — Other Ambulatory Visit: Payer: Self-pay

## 2021-03-21 ENCOUNTER — Ambulatory Visit (AMBULATORY_SURGERY_CENTER): Payer: Self-pay | Admitting: *Deleted

## 2021-03-21 VITALS — Ht 65.0 in | Wt 163.0 lb

## 2021-03-21 DIAGNOSIS — Z1211 Encounter for screening for malignant neoplasm of colon: Secondary | ICD-10-CM

## 2021-03-21 MED ORDER — PEG 3350-KCL-NA BICARB-NACL 420 G PO SOLR: 4000.0000 mL | Freq: Once | ORAL | 0 refills | Status: AC

## 2021-03-21 NOTE — Progress Notes (Signed)
Patient is here in-person for PV. Patient denies any allergies to eggs or soy. Patient denies any problems with anesthesia/sedation. Patient is not on any oxygen at home. Patient is not taking any diet/weight loss medications or blood thinners. Patient is aware of our care-partner policy and MBPJP-21 safety protocol.   Patient is COVID-19 vaccinated.

## 2021-03-26 ENCOUNTER — Other Ambulatory Visit: Payer: Self-pay | Admitting: Family Medicine

## 2021-03-27 ENCOUNTER — Other Ambulatory Visit: Payer: Self-pay | Admitting: Family Medicine

## 2021-04-04 ENCOUNTER — Other Ambulatory Visit: Payer: Self-pay

## 2021-04-04 ENCOUNTER — Encounter: Payer: Self-pay | Admitting: Gastroenterology

## 2021-04-04 ENCOUNTER — Ambulatory Visit (AMBULATORY_SURGERY_CENTER): Payer: Managed Care, Other (non HMO) | Admitting: Gastroenterology

## 2021-04-04 VITALS — BP 119/80 | HR 77 | Temp 98.4°F | Resp 15 | Ht 65.0 in | Wt 163.0 lb

## 2021-04-04 DIAGNOSIS — Z1211 Encounter for screening for malignant neoplasm of colon: Secondary | ICD-10-CM

## 2021-04-04 DIAGNOSIS — D125 Benign neoplasm of sigmoid colon: Secondary | ICD-10-CM

## 2021-04-04 MED ORDER — SODIUM CHLORIDE 0.9 % IV SOLN: 500.0000 mL | Freq: Once | INTRAVENOUS | Status: DC

## 2021-04-04 NOTE — Patient Instructions (Signed)
YOU HAD AN ENDOSCOPIC PROCEDURE TODAY AT THE [facility]:   Refer to the procedure report that was given to you for any specific questions about what was found during the examination.  If the procedure report does not answer your questions, please call your gastroenterologist to clarify.  If you requested that your care partner not be given the details of your procedure findings, then the procedure report has been included in a sealed envelope for you to review at your convenience later.  YOU SHOULD EXPECT: Some feelings of bloating in the abdomen. Passage of more gas than usual.  Walking can help get rid of the air that was put into your GI tract during the procedure and reduce the bloating. If you had a lower endoscopy (such as a colonoscopy or flexible sigmoidoscopy) you may notice spotting of blood in your stool or on the toilet paper. If you underwent a bowel prep for your procedure, you may not have a normal bowel movement for a few days.  Please Note:  You might notice some irritation and congestion in your nose or some drainage.  This is from the oxygen used during your procedure.  There is no need for concern and it should clear up in a day or so.  SYMPTOMS TO REPORT IMMEDIATELY:  Following lower endoscopy (colonoscopy or flexible sigmoidoscopy):  Excessive amounts of blood in the stool  Significant tenderness or worsening of abdominal pains  Swelling of the abdomen that is new, acute  Fever of 100F or higher   For urgent or emergent issues, a gastroenterologist can be reached at any hour by calling [phone]. Do not use MyChart messaging for urgent concerns.    DIET:  We do recommend a small meal at first, but then you may proceed to your regular diet.  Drink plenty of fluids but you should avoid alcoholic beverages for 24 hours.  MEDICATIONS:  Continue present medications.  Please see handouts given to you by your recovery nurse.  Thank you for allowing us to  provide for your healthcare needs today.  ACTIVITY:  You should plan to take it easy for the rest of today and you should NOT DRIVE or use heavy machinery until tomorrow (because of the sedation medicines used during the test).    FOLLOW UP: Our staff will call the number listed on your records 48-72 hours following your procedure to check on you and address any questions or concerns that you may have regarding the information given to you following your procedure. If we do not reach you, we will leave a message.  We will attempt to reach you two times.  During this call, we will ask if you have developed any symptoms of COVID 19. If you develop any symptoms (ie: fever, flu-like symptoms, shortness of breath, cough etc.) before then, please call [phone].  If you test positive for Covid 19 in the 2 weeks post procedure, please call and report this information to us.    If any biopsies were taken you will be contacted by phone or by letter within the next 1-3 weeks.  Please call us at [phone] if you have not heard about the biopsies in 3 weeks.    SIGNATURES/CONFIDENTIALITY: You and/or your care partner have signed paperwork which will be entered into your electronic medical record.  These signatures attest to the fact that that the information above on your After Visit Summary has been reviewed and is understood.  Full responsibility of the   confidentiality of this discharge information lies with you and/or your care-partner.  

## 2021-04-04 NOTE — Progress Notes (Signed)
Report given to PACU, vss 

## 2021-04-04 NOTE — Op Note (Signed)
[facility] Patient Name: Catlin Aycock Procedure Date: 04/04/2021 9:29 AM MRN: [id number] Endoscopist: Mallie Mussel L. Loletha Carrow , MD Age: 60 Referring MD:  Date of Birth: 29-Mar-1961 Gender: Male Account #: [id number] Procedure:                Colonoscopy Indications:              Screening for colorectal malignant neoplasm                           patient reports no polyps on last colonoscopy 10                            years prior in [place], [place] Medicines:                Monitored Anesthesia Care Procedure:                Pre-Anesthesia Assessment:                           - Prior to the procedure, a History and Physical                            was performed, and patient medications and                            allergies were reviewed. The patient's tolerance of                            previous anesthesia was also reviewed. The risks                            and benefits of the procedure and the sedation                            options and risks were discussed with the patient.                            All questions were answered, and informed consent                            was obtained. Prior Anticoagulants: The patient has                            taken no previous anticoagulant or antiplatelet                            agents. ASA Grade Assessment: II - A patient with                            mild systemic disease. After reviewing the risks                            and benefits, the patient was deemed in  satisfactory condition to undergo the procedure.                           After obtaining informed consent, the colonoscope                            was passed under direct vision. Throughout the                            procedure, the patient's blood pressure, pulse, and                            oxygen saturations were monitored continuously. The                            CF [id number] #[id number] was introduced through  the anus                            and advanced to the the cecum, identified by                            appendiceal orifice and ileocecal valve. The                            colonoscopy was performed without difficulty. The                            patient tolerated the procedure well. The quality                            of the bowel preparation was excellent. The                            ileocecal valve, appendiceal orifice, and rectum                            were photographed. Scope In: 9:36:48 AM Scope Out: 9:52:39 AM Scope Withdrawal Time: 0 hours 12 minutes 42 seconds  Total Procedure Duration: 0 hours 15 minutes 51 seconds  Findings:                 The perianal and digital rectal examinations were                            normal.                           A diminutive polyp was found in the distal sigmoid                            colon. The polyp was sessile. The polyp was removed                            with a cold snare. Resection and retrieval were  complete.                           Multiple diverticula were found in the left colon.                           The exam was otherwise without abnormality on                            direct and retroflexion views. Complications:            No immediate complications. Estimated Blood Loss:     Estimated blood loss was minimal. Impression:               - One diminutive polyp in the distal sigmoid colon,                            removed with a cold snare. Resected and retrieved.                           - Diverticulosis in the left colon.                           - The examination was otherwise normal on direct                            and retroflexion views. Recommendation:           - Patient has a contact number available for                            emergencies. The signs and symptoms of potential                            delayed complications were discussed with the                             patient. Return to normal activities tomorrow.                            Written discharge instructions were provided to the                            patient.                           - Resume previous diet.                           - Continue present medications.                           - Await pathology results.                           - Repeat colonoscopy is recommended for  surveillance. The colonoscopy date will be                            determined after pathology results from today's                            exam become available for review. Makaylin Carlo L. Loletha Carrow, MD 04/04/2021 10:00:13 AM This report has been signed electronically.

## 2021-04-04 NOTE — Progress Notes (Signed)
Called to room to assist during endoscopic procedure.  Patient ID and intended procedure confirmed with present staff. Received instructions for my participation in the procedure from the performing physician.  

## 2021-04-04 NOTE — Progress Notes (Signed)
History and Physical:  This patient presents for endoscopic testing for: Encounter Diagnosis  Name Primary?   Special screening for malignant neoplasms, colon Yes    Patient without complaints today. Reports no polyps on last screening colonoscopy 10 years ago in [place], [place]    Past Medical History: Past Medical History:  Diagnosis Date   Diabetes mellitus type 2 in obese ([place])    Hyperlipidemia    Hypertension      Past Surgical History: Past Surgical History:  Procedure Laterality Date   COLONOSCOPY  2012   In [place]-normal exam per pt   VASECTOMY      Allergies: Allergies  Allergen Reactions   Metformin Other (See Comments)    GI issues GI issues    Indomethacin Other (See Comments)    GI issues     Outpatient Meds: Current Outpatient Medications  Medication Sig Dispense Refill   acetaminophen (TYLENOL) 325 MG tablet Take 650 mg by mouth every 6 (six) hours as needed.     ALPRAZolam (XANAX) 0.5 MG tablet TAKE 1 TABLET BY MOUTH EVERY DAY AS NEEDED FOR ANXIETY 90 tablet 1   atorvastatin (LIPITOR) 80 MG tablet TAKE 1 TABLET BY MOUTH EVERY DAY FOR CHOLESTEROL 30 tablet 5   ezetimibe (ZETIA) 10 MG tablet TAKE 1 TABLET BY MOUTH EVERY DAY 30 tablet 3   fenofibrate (TRICOR) 145 MG tablet TAKE 1 TABLET BY MOUTH DAILY 90 tablet 2   glimepiride (AMARYL) 4 MG tablet Take 1 tablet by mouth twice daily 180 tablet 1   levothyroxine (SYNTHROID) 150 MCG tablet TAKE 1 TABLET BY MOUTH EVERY DAY AS DIRECTED 90 tablet 1   metFORMIN (GLUCOPHAGE) 1000 MG tablet TAKE 1 TABLET BY MOUTH 2 TIMES A DAY WITH A MEAL 180 tablet 1   omeprazole (PRILOSEC) 40 MG capsule TAKE 1 CAPSULE BY MOUTH 2 TIMES DAILY FOR ACID REFLUX 180 capsule 1   Semaglutide (RYBELSUS) 3 MG TABS Take 1 tablet by mouth daily.     Current Facility-Administered Medications  Medication Dose Route Frequency Provider Last Rate Last Admin   0.9 %  sodium chloride infusion  500 mL Intravenous Once Doran Stabler, MD          ___________________________________________________________________ Objective   Exam:  BP (!) 153/95   Pulse 77   Temp 98.4 F (36.9 C)   Resp 15   Ht 5\' 5"  (1.651 m)   Wt 163 lb (73.9 kg)   SpO2 100%   BMI 27.12 kg/m   CV: RRR without murmur, S1/S2 Resp: clear to auscultation bilaterally, normal RR and effort noted GI: soft, no tenderness, with active bowel sounds.   Assessment: Encounter Diagnosis  Name Primary?   Special screening for malignant neoplasms, colon Yes     Plan: Colonoscopy  The benefits and risks of the planned procedure were described in detail with the patient or (when appropriate) their health care proxy.  Risks were outlined as including, but not limited to, bleeding, infection, perforation, adverse medication reaction leading to cardiac or pulmonary decompensation, pancreatitis (if ERCP).  The limitation of incomplete mucosal visualization was also discussed.  No guarantees or warranties were given.    The patient is appropriate for an endoscopic procedure in the ambulatory setting.   - Wilfrid Lund, MD

## 2021-04-06 ENCOUNTER — Telehealth: Payer: Self-pay | Admitting: *Deleted

## 2021-04-06 ENCOUNTER — Telehealth: Payer: Self-pay

## 2021-04-06 NOTE — Telephone Encounter (Signed)
  Follow up Call-  Call back number 04/04/2021  Post procedure Call Back phone  # [phone]  Permission to leave phone message Yes     Patient questions:  Do you have a fever, pain , or abdominal swelling? No. Pain Score  0 *  Have you tolerated food without any problems? Yes.    Have you been able to return to your normal activities? Yes.    Do you have any questions about your discharge instructions: Diet   No. Medications  No. Follow up visit  No.  Do you have questions or concerns about your Care? No.  Actions: * If pain score is 4 or above: No action needed, pain <4.

## 2021-04-06 NOTE — Telephone Encounter (Signed)
  Follow up Call-  Call back number 04/04/2021  Post procedure Call Back phone  # [phone]  Permission to leave phone message Yes     Patient questions:  VM box is not set up.

## 2021-04-11 ENCOUNTER — Encounter: Payer: Self-pay | Admitting: Gastroenterology

## 2021-05-23 ENCOUNTER — Other Ambulatory Visit: Payer: Self-pay | Admitting: Family Medicine

## 2021-06-15 ENCOUNTER — Encounter: Payer: Self-pay | Admitting: Family Medicine

## 2021-06-15 ENCOUNTER — Ambulatory Visit (INDEPENDENT_AMBULATORY_CARE_PROVIDER_SITE_OTHER): Payer: Managed Care, Other (non HMO) | Admitting: Family Medicine

## 2021-06-15 ENCOUNTER — Other Ambulatory Visit: Payer: Self-pay | Admitting: Family Medicine

## 2021-06-15 VITALS — BP 125/82 | HR 95 | Temp 98.2°F | Ht 65.0 in | Wt 169.0 lb

## 2021-06-15 DIAGNOSIS — Z23 Encounter for immunization: Secondary | ICD-10-CM | POA: Diagnosis not present

## 2021-06-15 DIAGNOSIS — R748 Abnormal levels of other serum enzymes: Secondary | ICD-10-CM

## 2021-06-15 DIAGNOSIS — Z0000 Encounter for general adult medical examination without abnormal findings: Secondary | ICD-10-CM | POA: Diagnosis not present

## 2021-06-15 DIAGNOSIS — Z125 Encounter for screening for malignant neoplasm of prostate: Secondary | ICD-10-CM | POA: Diagnosis not present

## 2021-06-15 DIAGNOSIS — E1169 Type 2 diabetes mellitus with other specified complication: Secondary | ICD-10-CM

## 2021-06-15 DIAGNOSIS — E669 Obesity, unspecified: Secondary | ICD-10-CM | POA: Diagnosis not present

## 2021-06-15 LAB — MICROALBUMIN / CREATININE URINE RATIO
Creatinine,U: 124.3 mg/dL
Microalb Creat Ratio: 0.8 mg/g (ref 0.0–30.0)
Microalb, Ur: 1 mg/dL (ref 0.0–1.9)

## 2021-06-15 LAB — COMPREHENSIVE METABOLIC PANEL
ALT: 59 U/L — ABNORMAL HIGH (ref 0–53)
AST: 41 U/L — ABNORMAL HIGH (ref 0–37)
Albumin: 4.6 g/dL (ref 3.5–5.2)
Alkaline Phosphatase: 42 U/L (ref 39–117)
BUN: 13 mg/dL (ref 6–23)
CO2: 25 mEq/L (ref 19–32)
Calcium: 10.1 mg/dL (ref 8.4–10.5)
Chloride: 103 mEq/L (ref 96–112)
Creatinine, Ser: 1.1 mg/dL (ref 0.40–1.50)
GFR: 72.26 mL/min (ref >60.00)
Glucose, Bld: 99 mg/dL (ref 70–99)
Potassium: 4.7 mEq/L (ref 3.5–5.1)
Sodium: 138 mEq/L (ref 135–145)
Total Bilirubin: 0.8 mg/dL (ref 0.2–1.2)
Total Protein: 7.2 g/dL (ref 6.0–8.3)

## 2021-06-15 LAB — LIPID PANEL
Cholesterol: 137 mg/dL (ref 0–200)
HDL: 35.4 mg/dL — ABNORMAL LOW (ref >39.00)
LDL Cholesterol: 75 mg/dL (ref 0–99)
NonHDL: 101.8
Total CHOL/HDL Ratio: 4
Triglycerides: 136 mg/dL (ref 0.0–149.0)
VLDL: 27.2 mg/dL (ref 0.0–40.0)

## 2021-06-15 LAB — CBC
HCT: 40.1 % (ref 39.0–52.0)
Hemoglobin: 13.5 g/dL (ref 13.0–17.0)
MCHC: 33.7 g/dL (ref 30.0–36.0)
MCV: 94.1 fl (ref 78.0–100.0)
Platelets: 349 10*3/uL (ref 150.0–400.0)
RBC: 4.26 Mil/uL (ref 4.22–5.81)
RDW: 13.2 % (ref 11.5–15.5)
WBC: 6.1 10*3/uL (ref 4.0–10.5)

## 2021-06-15 LAB — TSH: TSH: 0.35 u[IU]/mL (ref 0.35–5.50)

## 2021-06-15 LAB — T4, FREE: Free T4: 1.24 ng/dL (ref 0.60–1.60)

## 2021-06-15 LAB — PSA: PSA: 0.18 ng/mL (ref 0.10–4.00)

## 2021-06-15 LAB — HEMOGLOBIN A1C: Hgb A1c MFr Bld: 6.5 % (ref 4.6–6.5)

## 2021-06-15 NOTE — Patient Instructions (Addendum)
Give [place] 2-3 business days to get the results of your labs back.   Keep the diet clean and stay active.  Let me know if you change your mind with the pneumonia vaccine.   The new Shingrix vaccine (for shingles) is a 2 shot series. It can make people feel low energy, achy and almost like they have the flu for 48 hours after injection. Please plan accordingly when deciding on when to get this shot. Call our office for a nurse visit appointment to get this. The second shot of the series is less severe regarding the side effects, but it still lasts 48 hours.   I recommend getting the updated bivalent covid vaccination booster at your convenience.   Let us know if you need anything.

## 2021-06-15 NOTE — Progress Notes (Signed)
Chief Complaint  Patient presents with   Annual Exam    Well Male Jeffery Farrell is here for a complete physical.   His last physical was >1 year ago.  Current diet: in general, a "healthy" diet.  Current exercise: walking Weight trend: intentionally losing Fatigue out of ordinary? No. Seat belt? Yes.   Advanced directive? No  Health maintenance Shingrix- No Colonoscopy- Yes Tetanus- Yes HIV- Yes Hep C- Yes   Past Medical History:  Diagnosis Date   Diabetes mellitus type 2 in obese ([place])    Hyperlipidemia    Hypertension       Past Surgical History:  Procedure Laterality Date   COLONOSCOPY  2012   In [place]-normal exam per pt   VASECTOMY      Medications  Current Outpatient Medications on File Prior to Visit  Medication Sig Dispense Refill   acetaminophen (TYLENOL) 325 MG tablet Take 650 mg by mouth every 6 (six) hours as needed.     ALPRAZolam (XANAX) 0.5 MG tablet TAKE 1 TABLET BY MOUTH EVERY DAY AS NEEDED FOR ANXIETY 90 tablet 1   atorvastatin (LIPITOR) 80 MG tablet TAKE 1 TABLET BY MOUTH EVERY DAY FOR CHOLESTEROL 30 tablet 5   ezetimibe (ZETIA) 10 MG tablet TAKE 1 TABLET BY MOUTH EVERY DAY 30 tablet 3   fenofibrate (TRICOR) 145 MG tablet TAKE 1 TABLET BY MOUTH DAILY 90 tablet 2   glimepiride (AMARYL) 4 MG tablet Take 1 tablet by mouth twice daily 180 tablet 1   levothyroxine (SYNTHROID) 150 MCG tablet TAKE 1 TABLET BY MOUTH EVERY DAY AS DIRECTED 90 tablet 1   metFORMIN (GLUCOPHAGE) 1000 MG tablet TAKE 1 TABLET BY MOUTH 2 TIMES A DAY WITH A MEAL 180 tablet 1   omeprazole (PRILOSEC) 40 MG capsule TAKE 1 CAPSULE BY MOUTH 2 TIMES DAILY FOR ACID REFLUX 180 capsule 1   Semaglutide (RYBELSUS) 3 MG TABS Take 1 tablet by mouth daily.      Allergies Allergies  Allergen Reactions   Metformin Other (See Comments)    GI issues GI issues    Indomethacin Other (See Comments)    GI issues     Family History Family History  Problem Relation Age of Onset    Colon cancer Neg Hx    Esophageal cancer Neg Hx    Rectal cancer Neg Hx    Colon polyps Neg Hx     Review of Systems: Constitutional:  no fevers Eye:  no recent significant change in vision Ear/Nose/Mouth/Throat:  Ears:  no hearing loss Nose/Mouth/Throat:  no complaints of nasal congestion, no sore throat Cardiovascular:  no chest pain Respiratory:  no shortness of breath Gastrointestinal:  no change in bowel habits GU:  Male: negative for dysuria, frequency Musculoskeletal/Extremities:  no joint pain Integumentary (Skin/Breast):  no abnormal skin lesions reported Neurologic:  no headaches Endocrine: No unexpected weight changes Hematologic/Lymphatic:  no abnormal bleeding  Exam BP 125/82   Pulse 95   Temp 98.2 F (36.8 C) (Oral)   Ht 5\' 5"  (1.651 m)   Wt 169 lb (76.7 kg)   SpO2 98%   BMI 28.12 kg/m  General:  well developed, well nourished, in no apparent distress Skin:  no significant moles, warts, or growths Head:  no masses, lesions, or tenderness Eyes:  pupils equal and round, sclera anicteric without injection Ears:  canals without lesions, TMs shiny without retraction, no obvious effusion, no erythema Nose:  nares patent, septum midline, mucosa normal Throat/Pharynx:  lips and  gingiva without lesion; tongue and uvula midline; non-inflamed pharynx; no exudates or postnasal drainage Neck: neck supple without adenopathy, thyromegaly, or masses Cardiac: RRR, no bruits, no LE edema Lungs:  clear to auscultation, breath sounds equal bilaterally, no respiratory distress Abdomen: BS+, soft, non-tender, non-distended, no masses or organomegaly noted Rectal: Deferred Musculoskeletal:  symmetrical muscle groups noted without atrophy or deformity Neuro:  gait normal; deep tendon reflexes normal and symmetric Psych: well oriented with normal range of affect and appropriate judgment/insight  Assessment and Plan  Well adult exam - Plan: CBC, Comprehensive metabolic panel,  Lipid panel, TSH, T4, free  Diabetes mellitus type 2 in obese (HCC) - Plan: Hemoglobin A1c, Microalbumin / creatinine urine ratio  Screening for prostate cancer - Plan: PSA   Well 60 y.o. male. Counseled on diet and exercise. Counseled on risks and benefits of prostate cancer screening with PSA. The patient agrees to undergo testing. Immunizations, labs, and further orders as above. Bivalent covid booster and Shingrix rec'd. Declined PCV20.  Flu shot today.  Advanced directive form given today.  Eye exam scheduled for Mar.  Follow up in 3-6 mo pending above. The patient voiced understanding and agreement to the plan.  [place], DO 06/15/21 7:07 AM

## 2021-06-15 NOTE — Addendum Note (Signed)
Addended by: Sharon Seller B on: 06/15/2021 07:21 AM   Modules accepted: Orders

## 2021-06-29 ENCOUNTER — Other Ambulatory Visit (INDEPENDENT_AMBULATORY_CARE_PROVIDER_SITE_OTHER): Payer: Managed Care, Other (non HMO)

## 2021-06-29 DIAGNOSIS — R748 Abnormal levels of other serum enzymes: Secondary | ICD-10-CM | POA: Diagnosis not present

## 2021-06-29 LAB — HEPATIC FUNCTION PANEL
ALT: 51 U/L (ref 0–53)
AST: 30 U/L (ref 0–37)
Albumin: 4.5 g/dL (ref 3.5–5.2)
Alkaline Phosphatase: 42 U/L (ref 39–117)
Bilirubin, Direct: 0.2 mg/dL (ref 0.0–0.3)
Total Bilirubin: 0.9 mg/dL (ref 0.2–1.2)
Total Protein: 7.3 g/dL (ref 6.0–8.3)

## 2021-07-31 ENCOUNTER — Other Ambulatory Visit: Payer: Self-pay | Admitting: Family Medicine

## 2021-08-17 ENCOUNTER — Telehealth: Payer: Self-pay | Admitting: Family Medicine

## 2021-08-17 NOTE — Telephone Encounter (Signed)
Patient would like to know if he can come and pick up some samples for rubulous. Please advise.

## 2021-08-17 NOTE — Telephone Encounter (Signed)
Called the patient informed he can pickup at the front desk.

## 2021-08-17 NOTE — Telephone Encounter (Signed)
We have one box of Rybelsus 3 mg #30 Ok to give the patient?

## 2021-08-24 ENCOUNTER — Other Ambulatory Visit: Payer: Self-pay | Admitting: Family Medicine

## 2021-08-24 DIAGNOSIS — E1169 Type 2 diabetes mellitus with other specified complication: Secondary | ICD-10-CM

## 2021-08-24 DIAGNOSIS — E669 Obesity, unspecified: Secondary | ICD-10-CM

## 2021-08-24 DIAGNOSIS — R739 Hyperglycemia, unspecified: Secondary | ICD-10-CM

## 2021-09-22 ENCOUNTER — Other Ambulatory Visit: Payer: Self-pay | Admitting: Family Medicine

## 2021-09-22 DIAGNOSIS — E782 Mixed hyperlipidemia: Secondary | ICD-10-CM

## 2021-09-28 ENCOUNTER — Other Ambulatory Visit: Payer: Self-pay

## 2021-09-28 MED ORDER — ALPRAZOLAM 0.5 MG PO TABS: ORAL_TABLET | ORAL | 1 refills | Status: DC

## 2021-09-28 NOTE — Telephone Encounter (Signed)
Patient stated he needs xanax for a death in family  ? ? ?Requesting: xanax ?Contract: n/a ?UDS:n/a ?Last Visit:12/22 ?Next Visit:12/14/21 ?Last Refill:11/24/21 ? ?Please Advise  ?

## 2021-10-06 ENCOUNTER — Other Ambulatory Visit: Payer: Self-pay | Admitting: Family Medicine

## 2021-10-31 ENCOUNTER — Other Ambulatory Visit: Payer: Self-pay | Admitting: Family Medicine

## 2021-11-05 ENCOUNTER — Ambulatory Visit (INDEPENDENT_AMBULATORY_CARE_PROVIDER_SITE_OTHER): Payer: Managed Care, Other (non HMO) | Admitting: Medical

## 2021-11-05 VITALS — BP 124/80 | HR 95 | Temp 98.0°F | Resp 16 | Ht 65.0 in | Wt 172.6 lb

## 2021-11-05 DIAGNOSIS — H1032 Unspecified acute conjunctivitis, left eye: Secondary | ICD-10-CM | POA: Diagnosis not present

## 2021-11-05 DIAGNOSIS — L089 Local infection of the skin and subcutaneous tissue, unspecified: Secondary | ICD-10-CM | POA: Diagnosis not present

## 2021-11-05 DIAGNOSIS — E663 Overweight: Secondary | ICD-10-CM | POA: Diagnosis not present

## 2021-11-05 MED ORDER — TOBRAMYCIN 0.3 % OP SOLN: 2.0000 [drp] | OPHTHALMIC | 0 refills | Status: DC

## 2021-11-05 MED ORDER — DOXYCYCLINE HYCLATE 100 MG PO TABS: 100.0000 mg | ORAL_TABLET | Freq: Two times a day (BID) | ORAL | 0 refills | Status: DC

## 2021-11-05 NOTE — Patient Instructions (Addendum)
You do appear to have bacterial conjunctivitis to left eye but also appearance of skin infection(possible early cellulitis). Will rx tobrex eye drops and doxycycline oral antibiotic. ? ?Want you to update me in 2 days by my chart or phone if eyes. ? ?If you do my chart picture attached to message would be helpful. ? ?If area of redness expands up to forehead and down to cheek then be seen in the ED. ? ?Discussed diet for wt loss today. ? ?Follow up date to be determined after message update. ?

## 2021-11-05 NOTE — Progress Notes (Signed)
? ?Subjective:  ? ? Patient ID: Jeffery Farrell, male    DOB: 1961-06-14, 61 y.o.   MRN: [id number] ? ?HPI ? ?Pt in for let eye lid swelling both upper lid and lower lid. Pt states before church his eye was itching just little bit but by end of church hit eye was very water. His upper and lower lid swelled a lot. All day yesterday and today having yellowish DC. ? 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 ? ?Pt states his grandson had infection to his left eye recently. Pt son also had eye infection. ? ?On review no vesicular eruption around eyes.  ? ?Review of Systems  ?Constitutional:  Negative for chills, fatigue and fever.  ?Eyes:  Positive for discharge, redness and itching. Negative for photophobia and visual disturbance.  ?     Mild eye itch.  ?Respiratory:  Negative for cough, chest tightness, wheezing and stridor.   ?Cardiovascular:  Negative for chest pain and palpitations.  ?Gastrointestinal:  Negative for abdominal pain.  ?Neurological:  Negative for dizziness, seizures and light-headedness.  ?Hematological:  Negative for adenopathy.  ?Psychiatric/Behavioral:  Negative for behavioral problems.   ? ? ?Past Medical History:  ?Diagnosis Date  ? Diabetes mellitus type 2 in obese [facility])   ? Hyperlipidemia   ? Hypertension   ? ?  ?Social History  ? ?Socioeconomic History  ? Marital status: Married  ?  Spouse name: Not on file  ? Number of children: Not on file  ? Years of education: Not on file  ? Highest education level: Not on file  ?Occupational History  ? Not on file  ?Tobacco Use  ? Smoking status: Every Day  ?  Packs/day: 0.10  ?  Types: Cigarettes  ? Smokeless tobacco: Never  ?Vaping Use  ? Vaping Use: Every day  ? Substances: Nicotine  ?Substance and Sexual Activity  ? Alcohol use: Yes  ?  Alcohol/week: 7.0 standard drinks  ?  Types: 7 Cans of beer per week  ? Drug use: Never  ? Sexual activity: Not on file  ?Other Topics Concern  ? Not on file  ?Social History Narrative  ? Not on file   ? ?Social Determinants of Health  ? ?Financial Resource Strain: Not on file  ?Food Insecurity: Not on file  ?Transportation Needs: Not on file  ?Physical Activity: Not on file  ?Stress: Not on file  ?Social Connections: Not on file  ?Intimate Partner Violence: Not on file  ? ? ?Past Surgical History:  ?Procedure Laterality Date  ? COLONOSCOPY  2012  ? In [place]-normal exam per pt  ? VASECTOMY    ? ? ?Family History  ?Problem Relation Age of Onset  ? Colon cancer Neg Hx   ? Esophageal cancer Neg Hx   ? Rectal cancer Neg Hx   ? Colon polyps Neg Hx   ? ? ?Allergies  ?Allergen Reactions  ? Metformin Other (See Comments)  ?  GI issues ?GI issues ?  ? Indomethacin Other (See Comments)  ?  GI issues ?  ? ? ?Current Outpatient Medications on File Prior to Visit  ?Medication Sig Dispense Refill  ? acetaminophen (TYLENOL) 325 MG tablet Take 650 mg by mouth every 6 (six) hours as needed.    ? ALPRAZolam (XANAX) 0.5 MG tablet TAKE 1 TABLET BY MOUTH EVERY DAY AS NEEDED FOR ANXIETY 90 tablet 1  ? atorvastatin (LIPITOR) 80 MG tablet TAKE 1 TABLET BY MOUTH EVERY DAY FOR CHOLESTEROL  30 tablet 5  ? ezetimibe (ZETIA) 10 MG tablet TAKE 1 TABLET BY MOUTH EVERY DAY 30 tablet 3  ? fenofibrate (TRICOR) 145 MG tablet TAKE 1 TABLET BY MOUTH DAILY 90 tablet 2  ? glimepiride (AMARYL) 4 MG tablet TAKE 1 TABLET BY MOUTH 2 TIMES A DAY FORBLOOD SUGAR 180 tablet 1  ? levothyroxine (SYNTHROID) 150 MCG tablet TAKE 1 TABLET BY MOUTH EVERY DAY AS DIRECTED 90 tablet 1  ? metFORMIN (GLUCOPHAGE) 1000 MG tablet TAKE 1 TABLET BY MOUTH 2 TIMES A DAY WITH MEALS 180 tablet 1  ? omeprazole (PRILOSEC) 40 MG capsule TAKE 1 CAPSULE BY MOUTH 2 TIMES DAILY FOR ACID REFLUX 180 capsule 1  ? Semaglutide (RYBELSUS) 3 MG TABS Take 1 tablet by mouth daily.    ? ?Current Facility-Administered Medications on File Prior to Visit  ?Medication Dose Route Frequency Provider Last Rate Last Admin  ? 0.9 %  sodium chloride infusion  500 mL Intravenous Once Doran Stabler, MD      ? ? ?BP 124/80 (BP Location: Right Arm, Patient Position: Sitting, Cuff Size: Normal)   Pulse 95   Temp 98 ?F (36.7 ?C) (Oral)   Resp 16   Ht '5\' 5"'$  (1.651 m)   Wt 172 lb 9.6 oz (78.3 kg)   SpO2 95%   BMI 28.72 kg/m?  ?  ?   ?Objective:  ? Physical Exam ? ?General- No acute distress. Pleasant patient. ?Neck- Full range of motion, no jvd ?Lungs- Clear, even and unlabored. ?Heart- regular rate and rhythm. ?Neurologic- CNII- XII grossly intact.  ?Eyes-peerl. Let eye injected conjunctiva. No dc presently. Both upper and lower lid swollen and red.  ? ? ? ?   ?Assessment & Plan:  ? ?Patient Instructions  ?You do appear to have bacterial conjunctivitis to left eye but also appearance of skin infection(possible early cellulitis). Will rx tobrex eye drops and doxycycline oral antibiotic. ? ?Want you to update me in 2 days by my chart or phone if eyes. ? ?If you do my chart picture attached to message would be helpful. ? ?If area of redness expands up to forehead and down to cheek then be seen in the ED. ? ?Discussed diet for wt loss today. ? ?Follow up date to be determined after message update.  ? ?Mackie Pai, PA-C  ?

## 2021-11-07 ENCOUNTER — Ambulatory Visit: Payer: Managed Care, Other (non HMO) | Admitting: Medical

## 2021-11-26 ENCOUNTER — Other Ambulatory Visit: Payer: Self-pay | Admitting: Family Medicine

## 2021-12-14 ENCOUNTER — Encounter: Payer: Self-pay | Admitting: Family Medicine

## 2021-12-14 ENCOUNTER — Ambulatory Visit (INDEPENDENT_AMBULATORY_CARE_PROVIDER_SITE_OTHER): Payer: Managed Care, Other (non HMO) | Admitting: Family Medicine

## 2021-12-14 VITALS — BP 120/74 | HR 100 | Temp 98.3°F | Ht 65.0 in | Wt 168.4 lb

## 2021-12-14 DIAGNOSIS — E782 Mixed hyperlipidemia: Secondary | ICD-10-CM

## 2021-12-14 DIAGNOSIS — E669 Obesity, unspecified: Secondary | ICD-10-CM

## 2021-12-14 DIAGNOSIS — E039 Hypothyroidism, unspecified: Secondary | ICD-10-CM | POA: Diagnosis not present

## 2021-12-14 DIAGNOSIS — R7989 Other specified abnormal findings of blood chemistry: Secondary | ICD-10-CM

## 2021-12-14 DIAGNOSIS — B353 Tinea pedis: Secondary | ICD-10-CM

## 2021-12-14 DIAGNOSIS — Z634 Disappearance and death of family member: Secondary | ICD-10-CM

## 2021-12-14 DIAGNOSIS — E1169 Type 2 diabetes mellitus with other specified complication: Secondary | ICD-10-CM | POA: Diagnosis not present

## 2021-12-14 LAB — TSH: TSH: 0.42 u[IU]/mL (ref 0.35–5.50)

## 2021-12-14 LAB — LIPID PANEL
Cholesterol: 144 mg/dL (ref 0–200)
HDL: 43.2 mg/dL (ref >39.00)
LDL Cholesterol: 75 mg/dL (ref 0–99)
NonHDL: 100.8
Total CHOL/HDL Ratio: 3
Triglycerides: 130 mg/dL (ref 0.0–149.0)
VLDL: 26 mg/dL (ref 0.0–40.0)

## 2021-12-14 LAB — COMPREHENSIVE METABOLIC PANEL
ALT: 60 U/L — ABNORMAL HIGH (ref 0–53)
AST: 46 U/L — ABNORMAL HIGH (ref 0–37)
Albumin: 4.5 g/dL (ref 3.5–5.2)
Alkaline Phosphatase: 45 U/L (ref 39–117)
BUN: 13 mg/dL (ref 6–23)
CO2: 27 mEq/L (ref 19–32)
Calcium: 10 mg/dL (ref 8.4–10.5)
Chloride: 102 mEq/L (ref 96–112)
Creatinine, Ser: 1.04 mg/dL (ref 0.40–1.50)
GFR: 77.02 mL/min (ref >60.00)
Glucose, Bld: 81 mg/dL (ref 70–99)
Potassium: 4.8 mEq/L (ref 3.5–5.1)
Sodium: 140 mEq/L (ref 135–145)
Total Bilirubin: 0.9 mg/dL (ref 0.2–1.2)
Total Protein: 7.1 g/dL (ref 6.0–8.3)

## 2021-12-14 LAB — HEMOGLOBIN A1C: Hgb A1c MFr Bld: 6.4 % (ref 4.6–6.5)

## 2021-12-14 MED ORDER — KETOCONAZOLE 2 % EX CREA: 1.0000 "application " | TOPICAL_CREAM | Freq: Every day | CUTANEOUS | 0 refills | Status: AC

## 2021-12-14 MED ORDER — SERTRALINE HCL 50 MG PO TABS: 50.0000 mg | ORAL_TABLET | Freq: Every day | ORAL | 3 refills | Status: DC

## 2021-12-14 NOTE — Addendum Note (Signed)
Addended by: Ames Coupe on: 12/14/2021 04:09 PM   Modules accepted: Orders

## 2021-12-14 NOTE — Patient Instructions (Signed)
Give [place] 2-3 business days to get the results of your labs back.   Keep the diet clean and stay active.  Please consider counseling. Contact [phone] to schedule an appointment or inquire about cost/insurance coverage.  [facility] located at [place], [place], [place].  Phone number = [phone].  Dr. Lennice Sites - Adult Psychiatry.    [facility] located at [place], [place], [place]. Phone number = [phone].   [facility] located at [address] [place], [place].  Phone number = [phone].   [facility] located at [place], [place], [place].  Phone number = [phone].  Coping skills Choose 5 that work for you: Take a deep breath Count to 20 Read a book Do a puzzle Meditate Bake Sing Knit Garden Pray Go outside Call a friend Listen to music Take a walk Color Send a note Take a bath Watch a movie Be alone in a quiet place Pet an animal Visit a friend Journal Exercise Stretch   Let us know if you need anything.

## 2021-12-14 NOTE — Progress Notes (Signed)
Subjective:   Chief Complaint  Patient presents with   Follow-up    Not taking Rybelsus--no samples    Jeffery Farrell is a 61 y.o. male here for follow-up of diabetes.   [place]'s self monitored glucose range is low 100's.  Patient denies hypoglycemic reactions. He checks his glucose levels a few times per week Patient does not require insulin.   Medications include: Amaryl 4 mg bid, Metformin 1000 mg bid Diet is healthy.  Exercise: walking  Hypothyroidism Patient presents for follow-up of hypothyroidism.  Reports compliance with medication- levothyroxine 150 mcg/d. Current symptoms include: denies fatigue, weight changes, heat/cold intolerance, bowel/skin changes or CVS symptoms He believes his dose should be unchanged  Hyperlipidemia Patient presents for mixed hyperlipidemia follow up. Currently being treated with Tricor 145 mg/d, Zetia 10 mg/d, Lipitor 80 mg/d and compliance with treatment thus far has been good. He denies myalgias. Diet/exercise as above.  The patient is not known to have coexisting coronary artery disease. No Cp or SOB.   Bereavement Patient was expecting a grandchild who is in the third trimester with his daughter.  Unfortunately the umbilical cord wrapped around the neck causing fetal demise.  Is been very anxious and depressed regarding this.  He will randomly break out into weeping spells.  He has Xanax which he rarely takes usually but has been taking more often.  He is not following with a counselor or psychologist.  No self-medication or homicidal/suicidal ideation.  He is willing to try something daily to help with his symptoms.  Past Medical History:  Diagnosis Date   Diabetes mellitus type 2 in obese ([place])    Hyperlipidemia    Hypertension      Related testing: Retinal exam: having done soon Pneumovax: done  Objective:  BP 120/74   Pulse 100   Temp 98.3 F (36.8 C) (Oral)   Ht '5\' 5"'$  (1.651 m)   Wt 168 lb 6 oz (76.4 kg)   SpO2 95%   BMI  28.02 kg/m  General:  Well developed, well nourished, in no apparent distress Skin: macerated tissue between 4/5th digits on the R foot.  Warm, no pallor or diaphoresis Head:  Normocephalic, atraumatic Lungs:  CTAB, no access msc use Cardio:  RRR, no bruits, no LE edema Musculoskeletal:  Symmetrical muscle groups noted without atrophy or deformity Neuro:  Sensation intact to pinprick on feet Psych: Age appropriate judgment and insight  Assessment:   Diabetes mellitus type 2 in obese (Jeffery Farrell) - Plan: Comprehensive metabolic panel, Lipid panel, Hemoglobin A1c  Hypothyroidism, unspecified type - Plan: TSH  Mixed hyperlipidemia  Bereavement  Tinea pedis of right foot - Plan: ketoconazole (NIZORAL) 2 % cream   Plan:   Chronic, stable. Cont Amaryl 4 mg bid, metformin 1000 mg bid. Counseled on diet and exercise. Chronic, stable. Cont levothyroxine 150 mcg/d.  Chronic, stable. Cont Lipitor 80 mg/d, Zetia 10 mg/d, Tricor 145 mg/d.  New problem, start Zoloft 25 mg daily for 2 weeks and then increase to 50 mg daily.  Counseling information and anxiety coping techniques provided in his AVS.  Counseled on exercise.  I would like to see him in 6 weeks to recheck this. 6 weeks of Nizoral cream daily between his fourth and fifth digit on the right. The patient voiced understanding and agreement to the plan.  [place], DO 12/14/21 8:40 AM

## 2021-12-19 LAB — HM DIABETES EYE EXAM

## 2021-12-20 ENCOUNTER — Telehealth: Payer: Self-pay | Admitting: Family Medicine

## 2021-12-20 ENCOUNTER — Encounter: Payer: Self-pay | Admitting: *Deleted

## 2021-12-20 ENCOUNTER — Ambulatory Visit (HOSPITAL_BASED_OUTPATIENT_CLINIC_OR_DEPARTMENT_OTHER)
Admission: RE | Admit: 2021-12-20 | Discharge: 2021-12-20 | Disposition: A | Discharge status: Home / Self Care | Payer: Managed Care, Other (non HMO) | Source: Ambulatory Visit | Attending: Family Medicine | Admitting: Family Medicine

## 2021-12-20 DIAGNOSIS — R7989 Other specified abnormal findings of blood chemistry: Secondary | ICD-10-CM | POA: Diagnosis present

## 2021-12-20 NOTE — Telephone Encounter (Signed)
Pt dropped off copy of document for provider to have on pts chart (1 page Diabetes communication Report- [facility]) Document put at front office tray under provider name.

## 2022-01-25 ENCOUNTER — Ambulatory Visit (INDEPENDENT_AMBULATORY_CARE_PROVIDER_SITE_OTHER): Payer: Managed Care, Other (non HMO) | Admitting: Family Medicine

## 2022-01-25 ENCOUNTER — Encounter: Payer: Self-pay | Admitting: Family Medicine

## 2022-01-25 VITALS — BP 132/86 | HR 79 | Temp 98.6°F | Ht 65.0 in | Wt 167.4 lb

## 2022-01-25 DIAGNOSIS — F411 Generalized anxiety disorder: Secondary | ICD-10-CM

## 2022-01-25 DIAGNOSIS — Z634 Disappearance and death of family member: Secondary | ICD-10-CM | POA: Diagnosis not present

## 2022-01-25 MED ORDER — SERTRALINE HCL 50 MG PO TABS: 50.0000 mg | ORAL_TABLET | Freq: Every day | ORAL | 3 refills | Status: DC

## 2022-01-25 MED ORDER — ALPRAZOLAM 0.5 MG PO TABS: ORAL_TABLET | ORAL | 1 refills | Status: DC

## 2022-01-25 NOTE — Patient Instructions (Signed)
Let me know if you want to try to come off of the Zoloft.  Let us know if you need anything.

## 2022-01-25 NOTE — Progress Notes (Signed)
Chief Complaint  Patient presents with   Follow-up    Subjective Jeffery Farrell presents for f/u bereavement.  The patient's daughter had a stillbirth. Pt is currently being treated with Zoloft 50 mg/d.  Reports compliance with no adverse effects. He also has Xanax as needed which has been helpful as well. Reports doing better since treatment. No thoughts of harming self or others. No self-medication with alcohol, prescription drugs or illicit drugs. Pt is not following with a counselor/psychologist.  Past Medical History:  Diagnosis Date   Diabetes mellitus type 2 in obese ([place])    Hyperlipidemia    Hypertension    Allergies as of 01/25/2022       Reactions   Metformin Other (See Comments)   GI issues GI issues   Indomethacin Other (See Comments)   GI issues        Medication List        Accurate as of January 25, 2022  9:53 AM. If you have any questions, ask your nurse or doctor.          acetaminophen 325 MG tablet Commonly known as: TYLENOL Take 650 mg by mouth every 6 (six) hours as needed.   ALPRAZolam 0.5 MG tablet Commonly known as: XANAX TAKE 1 TABLET BY MOUTH EVERY DAY AS NEEDED FOR ANXIETY   atorvastatin 80 MG tablet Commonly known as: LIPITOR TAKE 1 TABLET BY MOUTH EVERY DAY FOR CHOLESTEROL   ezetimibe 10 MG tablet Commonly known as: ZETIA TAKE 1 TABLET BY MOUTH EVERY DAY   fenofibrate 145 MG tablet Commonly known as: TRICOR TAKE 1 TABLET BY MOUTH DAILY   glimepiride 4 MG tablet Commonly known as: AMARYL TAKE 1 TABLET BY MOUTH 2 TIMES A DAY FORBLOOD SUGAR   ketoconazole 2 % cream Commonly known as: NIZORAL Apply 1 application  topically daily.   levothyroxine 150 MCG tablet Commonly known as: SYNTHROID TAKE 1 TABLET BY MOUTH EVERY DAY AS DIRECTED   metFORMIN 1000 MG tablet Commonly known as: GLUCOPHAGE TAKE 1 TABLET BY MOUTH 2 TIMES A DAY WITH MEALS   omeprazole 40 MG capsule Commonly known as: PRILOSEC TAKE 1 CAPSULE BY MOUTH 2  TIMES DAILY FOR ACID REFLUX   sertraline 50 MG tablet Commonly known as: ZOLOFT Take 1 tablet (50 mg total) by mouth daily. What changed: additional instructions Changed by: Shelda Pal, DO   tobramycin 0.3 % ophthalmic solution Commonly known as: Tobrex Place 2 drops into the left eye every 4 (four) hours.        Exam BP 132/86   Pulse 79   Temp 98.6 F (37 C) (Oral)   Ht '5\' 5"'$  (1.651 m)   Wt 167 lb 6 oz (75.9 kg)   SpO2 98%   BMI 27.85 kg/m  General:  well developed, well nourished, in no apparent distress Lungs:  No respiratory distress Psych: well oriented with normal range of affect and age-appropriate judgement/insight, alert and oriented x4.  Assessment and Plan  Bereavement - Plan: sertraline (ZOLOFT) 50 MG tablet  GAD (generalized anxiety disorder) - Plan: ALPRAZolam (XANAX) 0.5 MG tablet  We will continue Zoloft 50 mg daily indefinitely.  Continue Xanax 0.5 mg daily as needed as well. F/u in 5 months for a physical or as needed. The patient voiced understanding and agreement to the plan.  [place], DO 01/25/22 9:53 AM

## 2022-02-09 ENCOUNTER — Other Ambulatory Visit: Payer: Self-pay | Admitting: Family Medicine

## 2022-02-09 DIAGNOSIS — R739 Hyperglycemia, unspecified: Secondary | ICD-10-CM

## 2022-02-09 DIAGNOSIS — E1169 Type 2 diabetes mellitus with other specified complication: Secondary | ICD-10-CM

## 2022-03-22 ENCOUNTER — Other Ambulatory Visit: Payer: Self-pay | Admitting: Family Medicine

## 2022-03-22 ENCOUNTER — Telehealth: Payer: Self-pay

## 2022-03-22 ENCOUNTER — Encounter: Payer: Self-pay | Admitting: Family Medicine

## 2022-03-22 ENCOUNTER — Ambulatory Visit (INDEPENDENT_AMBULATORY_CARE_PROVIDER_SITE_OTHER): Payer: Managed Care, Other (non HMO) | Admitting: Family Medicine

## 2022-03-22 ENCOUNTER — Other Ambulatory Visit: Payer: Managed Care, Other (non HMO)

## 2022-03-22 VITALS — BP 130/80 | HR 89 | Temp 97.8°F | Ht 65.0 in | Wt 181.1 lb

## 2022-03-22 DIAGNOSIS — R790 Abnormal level of blood mineral: Secondary | ICD-10-CM

## 2022-03-22 DIAGNOSIS — R252 Cramp and spasm: Secondary | ICD-10-CM | POA: Diagnosis not present

## 2022-03-22 DIAGNOSIS — E039 Hypothyroidism, unspecified: Secondary | ICD-10-CM

## 2022-03-22 DIAGNOSIS — R5383 Other fatigue: Secondary | ICD-10-CM

## 2022-03-22 DIAGNOSIS — E669 Obesity, unspecified: Secondary | ICD-10-CM

## 2022-03-22 LAB — COMPREHENSIVE METABOLIC PANEL
ALT: 34 U/L (ref 0–53)
AST: 84 U/L — ABNORMAL HIGH (ref 0–37)
Albumin: 4.7 g/dL (ref 3.5–5.2)
Alkaline Phosphatase: 46 U/L (ref 39–117)
BUN: 17 mg/dL (ref 6–23)
CO2: 26 mEq/L (ref 19–32)
Calcium: 10.2 mg/dL (ref 8.4–10.5)
Chloride: 94 mEq/L — ABNORMAL LOW (ref 96–112)
Creatinine, Ser: 1.56 mg/dL — ABNORMAL HIGH (ref 0.40–1.50)
GFR: 47.26 mL/min — ABNORMAL LOW (ref >60.00)
Glucose, Bld: 154 mg/dL — ABNORMAL HIGH (ref 70–99)
Potassium: 4.6 mEq/L (ref 3.5–5.1)
Sodium: 131 mEq/L — ABNORMAL LOW (ref 135–145)
Total Bilirubin: 0.6 mg/dL (ref 0.2–1.2)
Total Protein: 7.5 g/dL (ref 6.0–8.3)

## 2022-03-22 LAB — LIPID PANEL
Cholesterol: 113 mg/dL (ref 0–200)
HDL: 56.6 mg/dL (ref >39.00)
LDL Cholesterol: 42 mg/dL (ref 0–99)
NonHDL: 56.07
Total CHOL/HDL Ratio: 2
Triglycerides: 70 mg/dL (ref 0.0–149.0)
VLDL: 14 mg/dL (ref 0.0–40.0)

## 2022-03-22 LAB — CBC
HCT: 37.9 % — ABNORMAL LOW (ref 39.0–52.0)
Hemoglobin: 12.9 g/dL — ABNORMAL LOW (ref 13.0–17.0)
MCHC: 34.1 g/dL (ref 30.0–36.0)
MCV: 95 fl (ref 78.0–100.0)
Platelets: 397 10*3/uL (ref 150.0–400.0)
RBC: 3.99 Mil/uL — ABNORMAL LOW (ref 4.22–5.81)
RDW: 14 % (ref 11.5–15.5)
WBC: 8.6 10*3/uL (ref 4.0–10.5)

## 2022-03-22 LAB — VITAMIN B12: Vitamin B-12: 138 pg/mL — ABNORMAL LOW (ref 211–911)

## 2022-03-22 LAB — MAGNESIUM: Magnesium: 0.9 mg/dL — CL (ref 1.5–2.5)

## 2022-03-22 LAB — T4, FREE: Free T4: <0.25 ng/dL — ABNORMAL LOW (ref 0.60–1.60)

## 2022-03-22 LAB — VITAMIN D 25 HYDROXY (VIT D DEFICIENCY, FRACTURES): VITD: 24.31 ng/mL — ABNORMAL LOW (ref 30.00–100.00)

## 2022-03-22 LAB — TSH: TSH: 78.5 u[IU]/mL — ABNORMAL HIGH (ref 0.35–5.50)

## 2022-03-22 MED ORDER — LEVOTHYROXINE SODIUM 150 MCG PO TABS: 150.0000 ug | ORAL_TABLET | Freq: Every day | ORAL | 3 refills | Status: DC

## 2022-03-22 MED ORDER — MAGNESIUM OXIDE -MG SUPPLEMENT 400 (240 MG) MG PO TABS: 400.0000 mg | ORAL_TABLET | Freq: Two times a day (BID) | ORAL | 0 refills | Status: DC

## 2022-03-22 NOTE — Progress Notes (Signed)
Chief Complaint  Patient presents with   Fatigue    Working a lot of overtime at work. Stress  Leg cramps Employer thinks may have had a stroke    Subjective: Patient is a 61 y.o. male here for fatigue.  Over past mo, pt has had more fatigue. He has had more stress at work and working more overtime. Gets around 9 hrs of sleep nightly, he does snore and has been told he needs a sleep study but does not want to do it. Sugars have well controlled. He cut out alcohol. He has gained some weight 2/2 stress.  His coworkers report he has been slurring his speech compared to usual.  It is intermittent and he thinks it is due to his fatigue.  He is here to make sure he has not had a stroke.  He denies any weakness, balance issues, vision changes, difficulty with speech, trouble swallowing.  He has been having associated muscle cramps in his lower extremities.  He is eating and drinking normally.  He does stay well hydrated.  The patient takes sertraline 50 mg daily for stress/depression.  Past Medical History:  Diagnosis Date   Diabetes mellitus type 2 in obese ([facility])    Hyperlipidemia    Hypertension     Objective: BP 130/80   Pulse 89   Temp 97.8 F (36.6 C) (Oral)   Ht '5\' 5"'$  (1.651 m)   Wt 181 lb 2 oz (82.2 kg)   SpO2 93%   BMI 30.14 kg/m  General: Awake, appears stated age Eyes: PERRLA, EOMI Mouth: MMM, Mallampati 4 Heart: RRR, no LE edema Lungs: CTAB, no rales, wheezes or rhonchi. No accessory muscle use Neuro: 5/5 strength throughout, DTRs equal and symmetric throughout, no clonus, no cerebellar signs, speech is fluent Psych: Age appropriate judgment and insight, normal affect and mood  Assessment and Plan: Fatigue, unspecified type - Plan: CBC, Comprehensive metabolic panel, Lipid panel, TSH, T4, free, VITAMIN D 25 Hydroxy (Vit-D Deficiency, Fractures)  Muscle cramps - Plan: B12, Magnesium  Continue Zoloft 50 mg daily.  Check above labs to rule out metabolic causes.  This is  likely related to increased work hours.  He probably has a component of sleep apnea as well.  I did offer to set him up with a specialist for sleep study which he politely declined at this time.  Counseled on diet and exercise, stay hydrated.  He will consider a pickle juice spoonful before bed to help with the cramping.  We will also ensure that the current dose of his levothyroxine is adequate. Follow-up pending the above or as originally scheduled. The patient voiced understanding and agreement to the plan.  I spent 30 minutes with the patient discussing the above plans in addition to reviewing his chart on the same day of the visit.  [place], DO 03/22/22  9:39 AM

## 2022-03-22 NOTE — Patient Instructions (Signed)
I do think sleep apnea is a consideration. If you change your mind and want to see a specialist to discuss a sleep study.  For the muscle cramping, drink lots of fluids. Also take a spoonful of pickle juice nightly. An alternative would be a teaspoon of mustard, but most people prefer pickle juice.   Give [place] 2-3 business days to get the results of your labs back.   Keep the diet clean and stay active.  I think it is unlikely that you had a stroke with your exam and history.  Let us know if you need anything.

## 2022-03-22 NOTE — Telephone Encounter (Signed)
Patient informed of results/instructions Scheduled lab appt. He verbalized understanding.

## 2022-03-22 NOTE — Telephone Encounter (Signed)
Let pt know Mg low, will send in supp, let's recheck Mon. Plz sched and order Mg. He can get this OTC, would want him to take 400 mg twice daily until his recheck. Ty.

## 2022-03-22 NOTE — Telephone Encounter (Signed)
CRITICAL VALUE STICKER  CRITICAL VALUE: MAGNESIUM 0.9  RECEIVER (on-site recipient of call): Micole Delehanty g  DATE & TIME NOTIFIED:  1:59 & 03/22/2022  MESSENGER (representative from lab): Sai  MD NOTIFIED:  TIME OF NOTIFICATION:  RESPONSE:

## 2022-03-23 ENCOUNTER — Other Ambulatory Visit: Payer: Self-pay | Admitting: Family Medicine

## 2022-03-25 ENCOUNTER — Other Ambulatory Visit (INDEPENDENT_AMBULATORY_CARE_PROVIDER_SITE_OTHER): Payer: Managed Care, Other (non HMO)

## 2022-03-25 DIAGNOSIS — R790 Abnormal level of blood mineral: Secondary | ICD-10-CM

## 2022-03-25 DIAGNOSIS — R252 Cramp and spasm: Secondary | ICD-10-CM | POA: Diagnosis not present

## 2022-03-25 DIAGNOSIS — R5383 Other fatigue: Secondary | ICD-10-CM

## 2022-03-25 LAB — INTRINSIC FACTOR ANTIBODIES: Intrinsic Factor: NEGATIVE

## 2022-03-26 ENCOUNTER — Telehealth: Payer: Self-pay | Admitting: *Deleted

## 2022-03-26 LAB — BASIC METABOLIC PANEL
BUN: 14 mg/dL (ref 6–23)
CO2: 24 mEq/L (ref 19–32)
Calcium: 10 mg/dL (ref 8.4–10.5)
Chloride: 91 mEq/L — ABNORMAL LOW (ref 96–112)
Creatinine, Ser: 1.46 mg/dL (ref 0.40–1.50)
GFR: 51.16 mL/min — ABNORMAL LOW (ref >60.00)
Glucose, Bld: 50 mg/dL — ABNORMAL LOW (ref 70–99)
Potassium: 4 mEq/L (ref 3.5–5.1)
Sodium: 127 mEq/L — ABNORMAL LOW (ref 135–145)

## 2022-03-26 LAB — CBC
HCT: 36.1 % — ABNORMAL LOW (ref 39.0–52.0)
Hemoglobin: 12.3 g/dL — ABNORMAL LOW (ref 13.0–17.0)
MCHC: 34.1 g/dL (ref 30.0–36.0)
MCV: 94.6 fl (ref 78.0–100.0)
Platelets: 399 10*3/uL (ref 150.0–400.0)
RBC: 3.82 Mil/uL — ABNORMAL LOW (ref 4.22–5.81)
RDW: 13.8 % (ref 11.5–15.5)
WBC: 11.2 10*3/uL — ABNORMAL HIGH (ref 4.0–10.5)

## 2022-03-26 LAB — MAGNESIUM: Magnesium: 1 mg/dL — CL (ref 1.5–2.5)

## 2022-03-26 MED ORDER — MAGNESIUM OXIDE -MG SUPPLEMENT 400 (240 MG) MG PO TABS: 800.0000 mg | ORAL_TABLET | Freq: Two times a day (BID) | ORAL | 0 refills | Status: DC

## 2022-03-26 NOTE — Telephone Encounter (Signed)
Appt made for 09/22.

## 2022-03-26 NOTE — Telephone Encounter (Signed)
Called to inform pt, vm has not been set up yet- will try again.

## 2022-03-26 NOTE — Telephone Encounter (Signed)
More Mg sent in, let's recheck Fri. Ty.

## 2022-03-26 NOTE — Telephone Encounter (Signed)
CRITICAL VALUE STICKER  CRITICAL VALUE:  Magnesium -- 1.0  RECEIVER (on-site recipient of call): Kelle Darting, [place] NOTIFIED: 03/26/22 @ 11:31  MESSENGER (representative from lab): Roger Kill  MD NOTIFIED: Nani Ravens  TIME OF NOTIFICATION: 11:31  RESPONSE:

## 2022-03-29 ENCOUNTER — Other Ambulatory Visit (INDEPENDENT_AMBULATORY_CARE_PROVIDER_SITE_OTHER): Payer: Managed Care, Other (non HMO)

## 2022-03-29 DIAGNOSIS — R5383 Other fatigue: Secondary | ICD-10-CM

## 2022-03-29 DIAGNOSIS — R790 Abnormal level of blood mineral: Secondary | ICD-10-CM | POA: Diagnosis not present

## 2022-03-29 DIAGNOSIS — R7989 Other specified abnormal findings of blood chemistry: Secondary | ICD-10-CM | POA: Diagnosis not present

## 2022-03-29 LAB — BASIC METABOLIC PANEL
BUN: 15 mg/dL (ref 6–23)
CO2: 27 mEq/L (ref 19–32)
Calcium: 10 mg/dL (ref 8.4–10.5)
Chloride: 94 mEq/L — ABNORMAL LOW (ref 96–112)
Creatinine, Ser: 1.44 mg/dL (ref 0.40–1.50)
GFR: 52.01 mL/min — ABNORMAL LOW (ref >60.00)
Glucose, Bld: 130 mg/dL — ABNORMAL HIGH (ref 70–99)
Potassium: 4.1 mEq/L (ref 3.5–5.1)
Sodium: 131 mEq/L — ABNORMAL LOW (ref 135–145)

## 2022-03-29 LAB — MAGNESIUM: Magnesium: 1.3 mg/dL — ABNORMAL LOW (ref 1.5–2.5)

## 2022-03-30 LAB — IRON,TIBC AND FERRITIN PANEL
%SAT: 22 % (calc) (ref 20–48)
Ferritin: 227 ng/mL (ref 24–380)
Iron: 121 ug/dL (ref 50–180)
TIBC: 541 mcg/dL (calc) — ABNORMAL HIGH (ref 250–425)

## 2022-03-31 ENCOUNTER — Other Ambulatory Visit: Payer: Self-pay | Admitting: Family Medicine

## 2022-03-31 MED ORDER — MAGNESIUM OXIDE -MG SUPPLEMENT 400 (240 MG) MG PO TABS: 400.0000 mg | ORAL_TABLET | Freq: Two times a day (BID) | ORAL | 1 refills | Status: DC

## 2022-04-05 ENCOUNTER — Telehealth: Payer: Self-pay | Admitting: *Deleted

## 2022-04-05 NOTE — Chronic Care Management (AMB) (Signed)
  Care Coordination  Note  04/05/2022 Name: Jeffery Farrell MRN: [id number] DOB: 06-02-1961  Jeffery Farrell is a 61 y.o. year old male who is a primary care patient of Shelda Pal, DO. I reached out to [employer] by phone today to offer care coordination services.      [name] was given information about Care Coordination services today including:  The Care Coordination services include support from the care team which includes your Nurse Coordinator, Clinical Social Worker, or Pharmacist.  The Care Coordination team is here to help remove barriers to the health concerns and goals most important to you. Care Coordination services are voluntary and the patient may decline or stop services at any time by request to their care team member.   Patient agreed to services and verbal consent obtained.   Follow up plan: Telephone appointment with care coordination team member scheduled for:05/03/22  [place]: [phone]

## 2022-04-23 ENCOUNTER — Other Ambulatory Visit: Payer: Self-pay | Admitting: Family Medicine

## 2022-04-26 ENCOUNTER — Ambulatory Visit (INDEPENDENT_AMBULATORY_CARE_PROVIDER_SITE_OTHER): Payer: Managed Care, Other (non HMO) | Admitting: Family Medicine

## 2022-04-26 ENCOUNTER — Encounter: Payer: Self-pay | Admitting: Family Medicine

## 2022-04-26 VITALS — BP 128/78 | HR 88 | Temp 98.0°F | Resp 16 | Ht 65.0 in | Wt 171.0 lb

## 2022-04-26 DIAGNOSIS — Z23 Encounter for immunization: Secondary | ICD-10-CM

## 2022-04-26 DIAGNOSIS — E039 Hypothyroidism, unspecified: Secondary | ICD-10-CM | POA: Diagnosis not present

## 2022-04-26 LAB — T4, FREE: Free T4: 1.25 ng/dL (ref 0.60–1.60)

## 2022-04-26 LAB — TSH: TSH: 3.08 u[IU]/mL (ref 0.35–5.50)

## 2022-04-26 NOTE — Patient Instructions (Signed)
Give us 2-3 business days to get the results of your labs back.   Keep the diet clean and stay active.  Let us know if you need anything. 

## 2022-04-26 NOTE — Progress Notes (Signed)
Chief Complaint  Patient presents with   Follow-up    Here for follow up    Subjective: Patient is a 61 y.o. male here for f/u.  Patient was seen around 1 month ago for intense fatigue.  We drew labs and he was found to be very low on his thyroid level.  It was found that he was not taking his levothyroxine.  He was placed back on this and feels much better.  He is still having slight fatigue due to working 7 days weekly and having stress.  He does not wish to change his anxiety medication.  Past Medical History:  Diagnosis Date   Diabetes mellitus type 2 in obese (HCC)    Hyperlipidemia    Hypertension     Objective: BP 128/78 (BP Location: Right Arm, Patient Position: Sitting, Cuff Size: Normal)   Pulse 88   Temp 98 F (36.7 C) (Oral)   Resp 16   Ht '5\' 5"'$  (1.651 m)   Wt 171 lb (77.6 kg)   SpO2 95%   BMI 28.46 kg/m  General: Awake, appears stated age Heart: RRR, no LE edema Lungs: CTAB, no rales, wheezes or rhonchi. No accessory muscle use Psych: Age appropriate judgment and insight, normal affect and mood  Assessment and Plan: Hypothyroidism, unspecified type - Plan: TSH, T4, free  Need for influenza vaccination - Plan: Flu Vaccine QUAD 6+ mos PF IM (Fluarix Quad PF)  Chronic, probably stable.  He looks and sounds much better today.  Check labs today.  Flu shot today.  Follow-up as originally scheduled or as needed. The patient voiced understanding and agreement to the plan.  [place], DO 04/26/22  10:27 AM

## 2022-05-03 ENCOUNTER — Telehealth: Payer: Managed Care, Other (non HMO)

## 2022-05-06 ENCOUNTER — Telehealth: Payer: Self-pay | Admitting: Pharmacist

## 2022-05-06 ENCOUNTER — Ambulatory Visit: Payer: Self-pay

## 2022-05-06 NOTE — Chronic Care Management (AMB) (Signed)
   CCM RN Visit Note   May 06, 2022 Name: Kysen Wetherington MRN: [id number]      DOB: 05-Mar-1961  Subjective: Samuell Knoble is a 61 y.o. year old male who is a primary care patient of Riki Sheer. The patient was referred to the Chronic Care Management team for assistance with care management needs subsequent to provider initiation of CCM services and plan of care.      An unsuccessful telephone outreach was attempted today to contact the patient about Chronic Care Management needs.     PLAN: A member of the care management team will try to reach Mr. Curfman on 05/07/22.  Horris Latino RN Care Manager/Chronic Care Management [phone]

## 2022-05-06 NOTE — Progress Notes (Signed)
    Chronic Care Management Pharmacy Assistant   Name: Scorpio Fortin  MRN: [id number] DOB: 09-09-1960  Jeffery Farrell is an 61 y.o. year old male who presents for his initial CCM visit with the clinical pharmacist.   Recent office visits:  04/26/22 Shelda Pal, DO-Family Medicine (F/u visit) Orders placed: Labs; Medication Changes:none 03/22/22 Shelda Pal, DO-Family Medicine (Fatigue) Orders placed: Labs; Medication Changes:none 01/16/22 Shelda Pal, DO-Family Medicine (Bereavement) Orders placed: none; Medication Changes:sertraline 50 mg 12/14/21 Shelda Pal, DO-Family Medicine (Diabetes) Orders placed: Labs; Medication Changes:Ketoconazole 25, Sertraline 50 mg  Recent consult visits:  None noted  Hospital visits:  None in previous 6 months  Medications: Outpatient Encounter Medications as of 05/06/2022  Medication Sig   acetaminophen (TYLENOL) 325 MG tablet Take 650 mg by mouth every 6 (six) hours as needed.   ALPRAZolam (XANAX) 0.5 MG tablet TAKE 1 TABLET BY MOUTH EVERY DAY AS NEEDED FOR ANXIETY   atorvastatin (LIPITOR) 80 MG tablet TAKE 1 TABLET BY MOUTH EVERY DAY FOR CHOLESTEROL   ezetimibe (ZETIA) 10 MG tablet TAKE 1 TABLET BY MOUTH EVERY DAY   fenofibrate (TRICOR) 145 MG tablet TAKE 1 TABLET BY MOUTH DAILY   glimepiride (AMARYL) 4 MG tablet TAKE 1 TABLET BY MOUTH 2 TIMES A DAY FORBLOOD SUGAR   levothyroxine (SYNTHROID) 150 MCG tablet Take 1 tablet (150 mcg total) by mouth daily.   magnesium oxide (MAG-OX) 400 (240 Mg) MG tablet Take 1 tablet (400 mg total) by mouth 2 (two) times daily.   metFORMIN (GLUCOPHAGE) 1000 MG tablet TAKE 1 TABLET BY MOUTH 2 TIMES A DAY WITH MEALS   omeprazole (PRILOSEC) 40 MG capsule TAKE 1 CAPSULE BY MOUTH 2 TIMES DAILY FOR ACID REFLUX   sertraline (ZOLOFT) 50 MG tablet Take 1 tablet (50 mg total) by mouth daily.   tobramycin (TOBREX) 0.3 % ophthalmic solution Place 2 drops into the left eye every 4 (four)  hours.   No facility-administered encounter medications on file as of 05/06/2022.   Medication List Acetaminophen (Tylenol) 325 mg tablet Alprazolam (Xanax) 0.5 mg tablet-LF 01/25/22 30 ds Atorvastatin (Lipitor) 80 mg tablet-LF 04/10/22 30 ds Ezetimibe (Zetia) 10 mg tablet-LF 03/23/22 30 ds Fenofibrate (Tricor) 145 mg tablet-LF 04/10/22 30 ds Glimepiride (Amaryl) 4 mg tablet-LF 04/10/22 30 ds Levothyroxine (Synthroid) 150 mcg tablet-LF 04/23/22 30 ds Magnesium oxide (Mag-OX) 400 (240 mg) tablet-LF 04/01/22 30 ds Metformin (Glucophage) 1000 mg tablet-LF 04/23/22 30 ds  Omeprazole (Prilosec) 40 mg capsule-LF 03/27/22 45 ds Sertraline (Zoloft) 50 mg tablet-LF 01/25/22 30 ds Tobramycin (Tobrex) 0.3% ophthalmic solution-LF 11/05/21 15 ds  Care Gaps: Colonoscopy-NA Diabetic Foot Exam-NA Ophthalmology-12/19/21 Dexa Scan - NA Annual Well Visit - NA Micro albumin-NA Hemoglobin A1c- 12/14/21  Star Rating Drugs: Atorvastatin (Lipitor) 80 mg tablet-LF 04/10/22 30 ds Metformin (Glucophage) 1000 mg tablet-LF 04/23/22 30 ds   [place] [phone]

## 2022-05-07 ENCOUNTER — Ambulatory Visit: Payer: Self-pay

## 2022-05-07 ENCOUNTER — Other Ambulatory Visit: Payer: Self-pay | Admitting: Family Medicine

## 2022-05-07 ENCOUNTER — Ambulatory Visit (INDEPENDENT_AMBULATORY_CARE_PROVIDER_SITE_OTHER): Payer: Managed Care, Other (non HMO) | Admitting: Pharmacist

## 2022-05-07 DIAGNOSIS — E669 Obesity, unspecified: Secondary | ICD-10-CM

## 2022-05-07 DIAGNOSIS — R7989 Other specified abnormal findings of blood chemistry: Secondary | ICD-10-CM

## 2022-05-07 DIAGNOSIS — E782 Mixed hyperlipidemia: Secondary | ICD-10-CM

## 2022-05-07 DIAGNOSIS — E1169 Type 2 diabetes mellitus with other specified complication: Secondary | ICD-10-CM

## 2022-05-07 DIAGNOSIS — E039 Hypothyroidism, unspecified: Secondary | ICD-10-CM

## 2022-05-07 DIAGNOSIS — E119 Type 2 diabetes mellitus without complications: Secondary | ICD-10-CM

## 2022-05-08 NOTE — Progress Notes (Signed)
Pharmacy Note  05/07/2022 Name: Jeffery Farrell MRN: [id number] DOB: 09/20/1960  Subjective: Jeffery Farrell is a 61 y.o. year old male who is a primary care patient of Shelda Pal, DO. Clinical Pharmacist Practitioner referral was placed to assist with medication management.    Engaged with patient by telephone for initial visit today.  Type 2 DM / Overweight- A1c is at goal. Currently taking glimepiride 36m twice a day and metformin 10074mtwice a day. Report some loose stool with metformin but is tolerable. He has also taken Ozmepic in past when he saw Dr DoRonney Lionut stopped due to cost. Patient is trying to lose weight with diet changes - smaller portions.  Elevated LFTs / fatty liver - Patient was having 1 or 2 drinks daily and several on the weekends. He has stopped drinking since June 2023 (father's day). He is ery concerned about his liver. Last ALT was WNL but AST was higher. He has had abdominal ultrasound which confirmed fatty liver.  Hyperlipidemia - Has had Tg > 500 but last Tg was 70 and LDL was 42. Taking fenofibrate 14562maily, atorvastatin 21m27mily and ezetimibe 10mg13mly.  Low vitamin D - Patient has started over-the-counter vitamin D 1000 IU daily. Vitamin D was 24.31 (03/29/2022) Low B12 - Patient has started over-the-counter vitamin B12 1000 mcg daily. Vitamin B12 was low at 138 (03/29/2022) Low magnesium - Taking mag ox 400mg 36me a day - started 03/2022 after magnesium was 0.9 (03/22/2022). Improved to 1.3 by 03/29/2022 after supplementation started.  Hypothyroidism - Patient has accidentally stopped taking levothyroxine for a few months. His last TSH was 78. He has restarted levothyroxine 150mcg 64my. He reports he feels much better / less fatigue.  Medication Management - discussed medication costs. Patient is able to pay for his medications as long as they are generic / low cost. He has a commercial high deductible plan (deductible is $1500 for 2023). He does  has a [employer] that he and his employer contribute to that he uses to pay for medications. He does express that money is tight. He is married but his wife is disabled - received Medicare compensation monthly. Yearly household income is < $60,000.   Objective: Review of patient status, including review of consultants reports, laboratory and other test data, was performed as part of comprehensive evaluation and provision of chronic care management services.      Latest Ref Rng & Units 03/22/2022    8:05 AM 12/14/2021    8:49 AM 06/29/2021    7:51 AM  Hepatic Function  Total Protein 6.0 - 8.3 g/dL 7.5  7.1  7.3   Albumin 3.5 - 5.2 g/dL 4.7  4.5  4.5   AST 0 - 37 U/L 84  46  30   ALT 0 - 53 U/L 34  60  51   Alk Phosphatase 39 - 117 U/L 46  45  42   Total Bilirubin 0.2 - 1.2 mg/dL 0.6  0.9  0.9   Bilirubin, Direct 0.0 - 0.3 mg/dL   0.2     Lab Results  Component Value Date   CREATININE 1.44 03/29/2022   CREATININE 1.46 03/25/2022   CREATININE 1.56 (H) 03/22/2022    Lab Results  Component Value Date   HGBA1C 6.4 12/14/2021       Component Value Date/Time   CHOL 113 03/22/2022 0805   TRIG 70.0 03/22/2022 0805   HDL 56.60 03/22/2022 0805   CHOLHDL 2 03/22/2022  0805   VLDL 14.0 03/22/2022 0805   LDLCALC 42 03/22/2022 0805   LDLCALC  04/14/2020 0735     Comment:     . LDL cholesterol not calculated. Triglyceride levels greater than 400 mg/dL invalidate calculated LDL results. . Reference range: <100 . Desirable range <100 mg/dL for primary prevention;   <70 mg/dL for patients with CHD or diabetic patients  with > or = 2 CHD risk factors. Marland Kitchen LDL-C is now calculated using the Martin-Hopkins  calculation, which is a validated novel method providing  better accuracy than the Friedewald equation in the  estimation of LDL-C.  Cresenciano Genre et al. Annamaria Helling. 5277;824(23): 2061-2068  (http://education.[facility].com/faq/FAQ164)    LDLDIRECT 48.0 06/16/2020 0743      Clinical ASCVD: No  The ASCVD Risk score (Arnett DK, et al., 2019) failed to calculate for the following reasons:   The valid total cholesterol range is 130 to 320 mg/dL    BP Readings from Last 3 Encounters:  04/26/22 128/78  03/22/22 130/80  01/25/22 132/86     Allergies  Allergen Reactions   Metformin Other (See Comments)    GI issues GI issues    Indomethacin Other (See Comments)    GI issues     Medications Reviewed Today     Reviewed by Cherre Robins, RPH-CPP (Pharmacist) on 05/07/22 at [place] List Status: <None>   Medication Order Taking? Sig Documenting Provider Last Dose Status Informant  acetaminophen (TYLENOL) 325 MG tablet [id number]  Take 650 mg by mouth every 6 (six) hours as needed. [provider]  Active   ALPRAZolam Duanne Moron) 0.5 MG tablet [id number] Yes TAKE 1 TABLET BY MOUTH EVERY DAY AS NEEDED FOR ANXIETY Shelda Pal, DO Taking Active   atorvastatin (LIPITOR) 80 MG tablet [id number] Yes TAKE 1 TABLET BY MOUTH EVERY DAY FOR CHOLESTEROL Shelda Pal, DO Taking Active   cholecalciferol (VITAMIN D3) 25 MCG (1000 UNIT) tablet [id number] Yes Take 1,000 Units by mouth daily. [provider] Taking Active   cyanocobalamin (VITAMIN B12) 1000 MCG tablet [id number] Yes Take 1,000 mcg by mouth daily. [provider] Taking Active   ezetimibe (ZETIA) 10 MG tablet [id number] Yes TAKE 1 TABLET BY MOUTH EVERY DAY Shelda Pal, DO Taking Active   fenofibrate (TRICOR) 145 MG tablet [id number] Yes TAKE 1 TABLET BY MOUTH DAILY Shelda Pal, DO Taking Active   glimepiride (AMARYL) 4 MG tablet [id number] Yes TAKE 1 TABLET BY MOUTH 2 TIMES A DAY FORBLOOD SUGAR Wendling, Crosby Oyster, DO Taking Active   levothyroxine (SYNTHROID) 150 MCG tablet [id number] Yes Take 1 tablet (150 mcg total) by mouth daily. Shelda Pal, DO Taking Active   magnesium oxide (MAG-OX) 400 (240 Mg) MG tablet [id number] Yes Take  1 tablet (400 mg total) by mouth 2 (two) times daily. Shelda Pal, DO Taking Active   metFORMIN (GLUCOPHAGE) 1000 MG tablet [id number] Yes TAKE 1 TABLET BY MOUTH 2 TIMES A DAY WITH MEALS Shelda Pal, DO Taking Active   omeprazole (PRILOSEC) 40 MG capsule [id number] Yes TAKE 1 CAPSULE BY MOUTH 2 TIMES DAILY FOR ACID REFLUX  Patient taking differently: Take 40 mg by mouth daily.   Shelda Pal, DO Taking Active   sertraline (ZOLOFT) 50 MG tablet [id number] Yes Take 1 tablet (50 mg total) by mouth daily. Shelda Pal, DO Taking Active             Patient Active Problem List   Diagnosis Date Noted  Hypothyroidism 12/14/2021   Chronic bilateral low back pain 01/21/2020   Mixed hyperlipidemia 10/29/2019   GAD (generalized anxiety disorder) 10/29/2019   Diabetes mellitus type 2 in obese ([place]) 04/23/2019     Medication Assistance:   will provide assistance as needed. Patient would likely qualify for medication assistance program if brand name medications needed in the future.    Assessment / Plan: Type 2 DM / Overweight- A1c is at goal. Weight has been improving Continue glimepiride 47m twice a day and metformin 10070mtwice a day.  Discussed changing to metformin ER but patient declined - feels occasional loose stools is tolerable.  Discussed that he would likely qualify for patient assistance for Ozempic is he wanted to retry. Ozempic has been shown to be beneficial for weight loss, diabetes and fatty liver. Will discuss with his PCP and apply for medication assistance program if retries Ozempic. Elevated LFTs / fatty liver - Patient has quit alcohol use, changed diet and lost weight. Anticipate that LFTs might be improved.  Encouraged him to continue to follow low sugar diet and avoid alcohol.  Other possible treatments include Ozempic. Pioglitazone has been recommended for fattly liver in the past but is not a favorable due to potential to  increase weight. Will discuss option with PCP.  Hyperlipidemia - At goals Continue fenofibrate 14547maily, atorvastatin 53m46mily and ezetimibe 10mg59mly.  Consider tapering dose of fenofibrate in future is Tg remain < 150 (could try 0.5 tablet for 6 months, then stop of Tg remain at goal) Low vitamin D -  Continue over-the-counter vitamin D 1000 IU daily.  Low B12 -  Continue over-the-counter vitamin B12 1000 mcg daily.  Low magnesium - improving with supplementation Continue mag ox 400mg 61me a day  Hypothyroidism - Not at goal. His last TSH was 78.  Continue levothyroxine 150mcg 65my. He reports he feels much better / less fatigue.  Medication Management - discussed medication costs.  Discussed option of using medication assistance program if he required a branded medication. He will notify me if need assistance with medication costs.   Called his health plan - [employer] to check on Deductible and out of pocket expense for 2023. Patient has to meet $2000 family deductible. He has met deductible - $2095 so far in 2023. Generic meds will be 10% of cost; branded meds will be 20% of cost and non preferred meds 30% of cost.  There is an out of pocket max of $4000 per family per year.   Follow Up:  Telephone follow up appointment with care management team member scheduled for:  1 month   Brinkley Peet ECherre RobinsD Clinical Pharmacist [phone]

## 2022-05-15 NOTE — Progress Notes (Signed)
05/15/2022 -  Dr Nani Ravens did approve adding  / changing to either Rybelsus or Ozempic depending on patient's preference. Discussed with patient today by phone. He would like to try Ozempic again. He had taken in past when he saw Dr Ronney Lion.  I did not have starting dose samples of Ozempic but will start medication assistance program process. Application mailed to patient. Will start with Ozempic 0.'25mg'$  weekly for 4 weeks, then 0.'5mg'$  weekly for 4 week. Will moved to '1mg'$  weekly if needed.

## 2022-05-18 ENCOUNTER — Other Ambulatory Visit: Payer: Self-pay | Admitting: Family Medicine

## 2022-05-20 NOTE — Chronic Care Management (AMB) (Signed)
Please Disregard: Notification received that patient does not qualify for CCM services

## 2022-05-28 ENCOUNTER — Telehealth: Payer: Self-pay | Admitting: Pharmacist

## 2022-05-28 NOTE — Telephone Encounter (Signed)
Application for [employer] patient assistance program that I mailed to patient came back return to sender - due to wrong address.  Called patient. Updated address - correct address is [address] [place], [id number] Previously we had [place].   Resent paperwork.

## 2022-06-05 ENCOUNTER — Telehealth: Payer: Managed Care, Other (non HMO)

## 2022-06-10 ENCOUNTER — Ambulatory Visit (INDEPENDENT_AMBULATORY_CARE_PROVIDER_SITE_OTHER): Payer: Managed Care, Other (non HMO) | Admitting: Pharmacist

## 2022-06-10 DIAGNOSIS — R7989 Other specified abnormal findings of blood chemistry: Secondary | ICD-10-CM

## 2022-06-10 DIAGNOSIS — E119 Type 2 diabetes mellitus without complications: Secondary | ICD-10-CM

## 2022-06-10 DIAGNOSIS — E782 Mixed hyperlipidemia: Secondary | ICD-10-CM

## 2022-06-10 NOTE — Progress Notes (Signed)
Pharmacy Note  05/07/2022 Name: Jeffery Farrell MRN: [id number] DOB: [birth year]  Subjective: Jeffery Farrell is a 61 y.o. year old male who is a primary care patient of Shelda Pal, DO. Clinical Pharmacist Practitioner referral was placed to assist with medication management.    Engaged with patient by telephone for initial visit today.  Type 2 DM / Overweight- A1c is at goal. Currently taking glimepiride 34m twice a day and metformin 10037mtwice a day. Report some loose stool with metformin but is tolerable. He has also taken Ozmepic in past when he saw Dr DoRonney Lionut stopped due to cost. Patient is trying to lose weight with diet changes - smaller portions.  Elevated LFTs / fatty liver - Patient was having 1 or 2 drinks daily and several on the weekends. He has not had alcohol since June 2023 (Father's day). Last ALT was WNL but AST was higher. He has had abdominal ultrasound which confirmed fatty liver.  Hyperlipidemia - Has had Tg > 500 but last Tg was 70 and LDL was 42. Taking fenofibrate 14573maily, atorvastatin 43m63mily and ezetimibe 10mg12mly.   Medication Management - discussed medication costs. Patient is able to pay for his medications as long as they are generic / low cost. He has a commercial high deductible plan (deductible is $1500 for 2023). He does has a [employer] that he and his employer contribute to that he uses to pay for medications. He does express that money is tight. He is married but his wife is disabled - receives MedicMining engineerhly. Yearly household income is < $60,000.   Objective: Review of patient status, including review of consultants reports, laboratory and other test data, was performed as part of comprehensive evaluation and provision of chronic care management services.      Latest Ref Rng & Units 03/22/2022    8:05 AM 12/14/2021    8:49 AM 06/29/2021    7:51 AM  Hepatic Function  Total Protein 6.0 - 8.3 g/dL 7.5  7.1  7.3    Albumin 3.5 - 5.2 g/dL 4.7  4.5  4.5   AST 0 - 37 U/L 84  46  30   ALT 0 - 53 U/L 34  60  51   Alk Phosphatase 39 - 117 U/L 46  45  42   Total Bilirubin 0.2 - 1.2 mg/dL 0.6  0.9  0.9   Bilirubin, Direct 0.0 - 0.3 mg/dL   0.2     Lab Results  Component Value Date   CREATININE 1.44 03/29/2022   CREATININE 1.46 03/25/2022   CREATININE 1.56 (H) 03/22/2022    Lab Results  Component Value Date   HGBA1C 6.4 12/14/2021       Component Value Date/Time   CHOL 113 03/22/2022 0805   TRIG 70.0 03/22/2022 0805   HDL 56.60 03/22/2022 0805   CHOLHDL 2 03/22/2022 0805   VLDL 14.0 03/22/2022 0805   LDLCALC 42 03/22/2022 0805   LDLCALC  04/14/2020 0735     Comment:     . LDL cholesterol not calculated. Triglyceride levels greater than 400 mg/dL invalidate calculated LDL results. . Reference range: <100 . Desirable range <100 mg/dL for primary prevention;   <70 mg/dL for patients with CHD or diabetic patients  with > or = 2 CHD risk factors. . LDLMarland KitchenC is now calculated using the Martin-Hopkins  calculation, which is a validated novel method providing  better accuracy than the Friedewald equation in the  estimation of LDL-C.  Cresenciano Genre et al. Annamaria Helling. 9233;007(62): 2061-2068  (http://education.[facility].com/faq/FAQ164)    LDLDIRECT 48.0 06/16/2020 0743     Clinical ASCVD: No  The ASCVD Risk score (Arnett DK, et al., 2019) failed to calculate for the following reasons:   The valid total cholesterol range is 130 to 320 mg/dL    BP Readings from Last 3 Encounters:  04/26/22 128/78  03/22/22 130/80  01/25/22 132/86     Allergies  Allergen Reactions   Metformin Other (See Comments)    GI issues GI issues    Indomethacin Other (See Comments)    GI issues     Medications Reviewed Today     Reviewed by Cherre Robins, RPH-CPP (Pharmacist) on 05/07/22 at [place] List Status: <None>   Medication Order Taking? Sig Documenting Provider Last Dose Status Informant   acetaminophen (TYLENOL) 325 MG tablet [id number]  Take 650 mg by mouth every 6 (six) hours as needed. [provider]  Active   ALPRAZolam Duanne Moron) 0.5 MG tablet [id number] Yes TAKE 1 TABLET BY MOUTH EVERY DAY AS NEEDED FOR ANXIETY Shelda Pal, DO Taking Active   atorvastatin (LIPITOR) 80 MG tablet [id number] Yes TAKE 1 TABLET BY MOUTH EVERY DAY FOR CHOLESTEROL Shelda Pal, DO Taking Active   cholecalciferol (VITAMIN D3) 25 MCG (1000 UNIT) tablet [id number] Yes Take 1,000 Units by mouth daily. [provider] Taking Active   cyanocobalamin (VITAMIN B12) 1000 MCG tablet [id number] Yes Take 1,000 mcg by mouth daily. [provider] Taking Active   ezetimibe (ZETIA) 10 MG tablet [id number] Yes TAKE 1 TABLET BY MOUTH EVERY DAY Shelda Pal, DO Taking Active   fenofibrate (TRICOR) 145 MG tablet [id number] Yes TAKE 1 TABLET BY MOUTH DAILY Shelda Pal, DO Taking Active   glimepiride (AMARYL) 4 MG tablet [id number] Yes TAKE 1 TABLET BY MOUTH 2 TIMES A DAY FORBLOOD SUGAR Wendling, Crosby Oyster, DO Taking Active   levothyroxine (SYNTHROID) 150 MCG tablet [id number] Yes Take 1 tablet (150 mcg total) by mouth daily. Shelda Pal, DO Taking Active   magnesium oxide (MAG-OX) 400 (240 Mg) MG tablet [id number] Yes Take 1 tablet (400 mg total) by mouth 2 (two) times daily. Shelda Pal, DO Taking Active   metFORMIN (GLUCOPHAGE) 1000 MG tablet [id number] Yes TAKE 1 TABLET BY MOUTH 2 TIMES A DAY WITH MEALS Shelda Pal, DO Taking Active   omeprazole (PRILOSEC) 40 MG capsule [id number] Yes TAKE 1 CAPSULE BY MOUTH 2 TIMES DAILY FOR ACID REFLUX  Patient taking differently: Take 40 mg by mouth daily.   Shelda Pal, DO Taking Active   sertraline (ZOLOFT) 50 MG tablet [id number] Yes Take 1 tablet (50 mg total) by mouth daily. Shelda Pal, DO Taking Active             Patient Active Problem List    Diagnosis Date Noted   Hypothyroidism 12/14/2021   Chronic bilateral low back pain 01/21/2020   Mixed hyperlipidemia 10/29/2019   GAD (generalized anxiety disorder) 10/29/2019   Diabetes mellitus type 2 in obese ([place]) 04/23/2019     Medication Assistance:  Application for Ozempic  medication assistance program. in process.  Anticipated assistance start date 07/08/2022.  See plan of care for additional detail.   Assessment / Plan: Type 2 DM / Overweight- A1c is at goal. Weight has been improving Continue glimepiride 52m twice a day and metformin 1007mtwice a day.  Discussed changing to metformin ER but patient declined -  feels occasional loose stools is tolerable.  Patient signed application for Ozempic and has returned to office.  Completed provider portion and forwarded to PCP to review and sign. Elevated LFTs / fatty liver - Patient has quit alcohol use, changed diet and lost weight. Anticipate that LFTs might be improved.  Encouraged him to continue to follow low sugar diet and avoid alcohol.  Hyperlipidemia - At goals Continue fenofibrate 141m daily, atorvastatin 832mdaily and ezetimibe 1018maily.  Consider tapering dose of fenofibrate in future is Tg remain < 150 (could try 0.5 tablet for 6 months, then stop of Tg remain at goal) Medication Management - discussed medication costs.  Discussed option of using medication assistance program if he required a branded medication.  Completed Ozeimpc medication assistance program.   Called his health plan - [employer] to check on Deductible and out of pocket expense for 2023. Patient has to meet $2000 family deductible. He has met deductible - $2100 so far in 2023. Generic meds will be 10% of cost; branded meds will be 20% of cost and non preferred meds 30% of cost.  There is an out of pocket max of $4000 per family per year.   Follow Up:  Telephone follow up appointment with care management team member scheduled for:  1 month   TamCherre RobinsharmD Clinical Pharmacist [place] [phone]

## 2022-06-15 ENCOUNTER — Other Ambulatory Visit: Payer: Self-pay | Admitting: Family Medicine

## 2022-07-05 ENCOUNTER — Ambulatory Visit (INDEPENDENT_AMBULATORY_CARE_PROVIDER_SITE_OTHER): Payer: Managed Care, Other (non HMO) | Admitting: Family Medicine

## 2022-07-05 ENCOUNTER — Encounter: Payer: Self-pay | Admitting: Family Medicine

## 2022-07-05 VITALS — BP 132/76 | HR 99 | Temp 98.0°F | Resp 18 | Ht 65.0 in | Wt 172.0 lb

## 2022-07-05 DIAGNOSIS — Z125 Encounter for screening for malignant neoplasm of prostate: Secondary | ICD-10-CM

## 2022-07-05 DIAGNOSIS — E1169 Type 2 diabetes mellitus with other specified complication: Secondary | ICD-10-CM

## 2022-07-05 DIAGNOSIS — E669 Obesity, unspecified: Secondary | ICD-10-CM

## 2022-07-05 DIAGNOSIS — Z0000 Encounter for general adult medical examination without abnormal findings: Secondary | ICD-10-CM

## 2022-07-05 LAB — CBC
HCT: 39.8 % (ref 39.0–52.0)
Hemoglobin: 13.3 g/dL (ref 13.0–17.0)
MCHC: 33.5 g/dL (ref 30.0–36.0)
MCV: 94.1 fl (ref 78.0–100.0)
Platelets: 382 10*3/uL (ref 150.0–400.0)
RBC: 4.23 Mil/uL (ref 4.22–5.81)
RDW: 12.7 % (ref 11.5–15.5)
WBC: 6.3 10*3/uL (ref 4.0–10.5)

## 2022-07-05 LAB — LIPID PANEL
Cholesterol: 140 mg/dL (ref 0–200)
HDL: 32.8 mg/dL — ABNORMAL LOW (ref >39.00)
NonHDL: 107.64
Total CHOL/HDL Ratio: 4
Triglycerides: 208 mg/dL — ABNORMAL HIGH (ref 0.0–149.0)
VLDL: 41.6 mg/dL — ABNORMAL HIGH (ref 0.0–40.0)

## 2022-07-05 LAB — COMPREHENSIVE METABOLIC PANEL
ALT: 55 U/L — ABNORMAL HIGH (ref 0–53)
AST: 35 U/L (ref 0–37)
Albumin: 4.5 g/dL (ref 3.5–5.2)
Alkaline Phosphatase: 38 U/L — ABNORMAL LOW (ref 39–117)
BUN: 18 mg/dL (ref 6–23)
CO2: 28 mEq/L (ref 19–32)
Calcium: 10.1 mg/dL (ref 8.4–10.5)
Chloride: 100 mEq/L (ref 96–112)
Creatinine, Ser: 1.16 mg/dL (ref 0.40–1.50)
GFR: 67.29 mL/min (ref >60.00)
Glucose, Bld: 152 mg/dL — ABNORMAL HIGH (ref 70–99)
Potassium: 4.8 mEq/L (ref 3.5–5.1)
Sodium: 137 mEq/L (ref 135–145)
Total Bilirubin: 0.7 mg/dL (ref 0.2–1.2)
Total Protein: 7.1 g/dL (ref 6.0–8.3)

## 2022-07-05 LAB — MICROALBUMIN / CREATININE URINE RATIO
Creatinine,U: 33.3 mg/dL
Microalb Creat Ratio: 2.1 mg/g (ref 0.0–30.0)
Microalb, Ur: <0.7 mg/dL (ref 0.0–1.9)

## 2022-07-05 LAB — HEMOGLOBIN A1C: Hgb A1c MFr Bld: 7.1 % — ABNORMAL HIGH (ref 4.6–6.5)

## 2022-07-05 LAB — PSA: PSA: 0.19 ng/mL (ref 0.10–4.00)

## 2022-07-05 LAB — LDL CHOLESTEROL, DIRECT: Direct LDL: 80 mg/dL

## 2022-07-05 MED ORDER — OZEMPIC (0.25 OR 0.5 MG/DOSE) 2 MG/1.5ML ~~LOC~~ SOPN: PEN_INJECTOR | SUBCUTANEOUS | 1 refills | Status: DC

## 2022-07-05 NOTE — Progress Notes (Signed)
Chief Complaint  Patient presents with   Annual Exam    Well Male Jeffery Farrell is here for a complete physical.   His last physical was >1 year ago.  Current diet: in general, a "healthy" diet.  Current exercise: walking Weight trend: up a few lbs Fatigue out of ordinary? No. Seat belt? Yes.   Advanced directive? No  Health maintenance Shingrix- No Colonoscopy- Yes Tetanus- Yes HIV- Yes Hep C- Yes   Past Medical History:  Diagnosis Date   Diabetes mellitus type 2 in obese ([place])    Hyperlipidemia    Hypertension       Past Surgical History:  Procedure Laterality Date   COLONOSCOPY  2012   In [place]-normal exam per pt   VASECTOMY      Medications  Current Outpatient Medications on File Prior to Visit  Medication Sig Dispense Refill   acetaminophen (TYLENOL) 325 MG tablet Take 650 mg by mouth every 6 (six) hours as needed.     ALPRAZolam (XANAX) 0.5 MG tablet TAKE 1 TABLET BY MOUTH EVERY DAY AS NEEDED FOR ANXIETY 90 tablet 1   atorvastatin (LIPITOR) 80 MG tablet TAKE 1 TABLET BY MOUTH EVERY DAY FOR CHOLESTEROL 30 tablet 5   cholecalciferol (VITAMIN D3) 25 MCG (1000 UNIT) tablet Take 1,000 Units by mouth daily.     cyanocobalamin (VITAMIN B12) 1000 MCG tablet Take 1,000 mcg by mouth daily.     ezetimibe (ZETIA) 10 MG tablet TAKE 1 TABLET BY MOUTH EVERY DAY 30 tablet 3   fenofibrate (TRICOR) 145 MG tablet TAKE 1 TABLET BY MOUTH DAILY 90 tablet 2   glimepiride (AMARYL) 4 MG tablet TAKE 1 TABLET BY MOUTH 2 TIMES A DAY FORBLOOD SUGAR 180 tablet 1   levothyroxine (SYNTHROID) 150 MCG tablet Take 1 tablet (150 mcg total) by mouth daily. 90 tablet 3   magnesium oxide (MAG-OX) 400 (240 Mg) MG tablet TAKE 1 TABLET BY MOUTH TWICE DAILY 60 tablet 1   metFORMIN (GLUCOPHAGE) 1000 MG tablet TAKE 1 TABLET BY MOUTH 2 TIMES A DAY WITH MEALS 180 tablet 1   omeprazole (PRILOSEC) 40 MG capsule TAKE 1 CAPSULE BY MOUTH 2 TIMES DAILY FOR ACID REFLUX (Patient taking differently: Take 40  mg by mouth daily.) 180 capsule 1   sertraline (ZOLOFT) 50 MG tablet Take 1 tablet (50 mg total) by mouth daily. 30 tablet 3    Allergies Allergies  Allergen Reactions   Metformin Other (See Comments)    GI issues GI issues    Indomethacin Other (See Comments)    GI issues     Family History Family History  Problem Relation Age of Onset   Colon cancer Neg Hx    Esophageal cancer Neg Hx    Rectal cancer Neg Hx    Colon polyps Neg Hx     Review of Systems: Constitutional:  no fevers Eye:  no recent significant change in vision Ear/Nose/Mouth/Throat:  Ears:  no hearing loss Nose/Mouth/Throat:  no complaints of nasal congestion, no sore throat Cardiovascular:  no chest pain Respiratory:  no shortness of breath Gastrointestinal:  no change in bowel habits GU:  Male: negative for dysuria, frequency Musculoskeletal/Extremities:  no joint pain Integumentary (Skin/Breast):  no abnormal skin lesions reported Neurologic:  no headaches Endocrine: No unexpected weight changes Hematologic/Lymphatic:  no abnormal bleeding  Exam BP 132/76   Pulse 99   Temp 98 F (36.7 C)   Resp 18   Ht '5\' 5"'$  (1.651 m)   Wt  172 lb (78 kg)   SpO2 96%   BMI 28.62 kg/m  General:  well developed, well nourished, in no apparent distress Skin:  no significant moles, warts, or growths Head:  no masses, lesions, or tenderness Eyes:  pupils equal and round, sclera anicteric without injection Ears:  canals without lesions, TMs shiny without retraction, no obvious effusion, no erythema Nose:  nares patent, mucosa normal Throat/Pharynx:  lips and gingiva without lesion; tongue and uvula midline; non-inflamed pharynx; no exudates or postnasal drainage Neck: neck supple without adenopathy, thyromegaly, or masses Cardiac: RRR, no bruits, no LE edema Lungs:  clear to auscultation, breath sounds equal bilaterally, no respiratory distress Abdomen: BS+, soft, non-tender, non-distended, no masses or  organomegaly noted Rectal: Deferred Musculoskeletal:  symmetrical muscle groups noted without atrophy or deformity Neuro:  gait normal; deep tendon reflexes normal and symmetric Psych: well oriented with normal range of affect and appropriate judgment/insight  Assessment and Plan  Well adult exam - Plan: CBC, Comprehensive metabolic panel, Lipid panel  Diabetes mellitus type 2 in obese (HCC) - Plan: Hemoglobin A1c, Microalbumin / creatinine urine ratio  Screening for prostate cancer - Plan: PSA   Well 61 y.o. male. Counseled on diet and exercise. Counseled on risks and benefits of prostate cancer screening with PSA. The patient agrees to undergo testing. Advanced directive form provided today.  Discussed Shingrix. Immunizations, labs, and further orders as above. Follow up in 6 mo. The patient voiced understanding and agreement to the plan.  [place], DO 07/05/22 7:58 AM

## 2022-07-05 NOTE — Patient Instructions (Signed)
Give us 2-3 business days to get the results of your labs back.   Keep the diet clean and stay active.  Please get me a copy of your advanced directive form at your convenience.   Let us know if you need anything.  

## 2022-07-05 NOTE — Addendum Note (Signed)
Addended by: Manuela Schwartz on: 07/05/2022 08:28 AM   Modules accepted: Orders

## 2022-07-12 ENCOUNTER — Telehealth: Payer: Self-pay | Admitting: Family Medicine

## 2022-07-12 NOTE — Telephone Encounter (Signed)
Patient stated will drop off financial info for patient assitance appl Will forward information to Tammy Warehouse manager) to take care of.

## 2022-07-12 NOTE — Telephone Encounter (Signed)
Patient called to speak with CMA Robin. He would like a call back to discuss the information she needed from him.

## 2022-08-05 NOTE — Addendum Note (Signed)
Addended by: Cherre Robins B on: 08/05/2022 03:33 PM   Modules accepted: Orders

## 2022-08-05 NOTE — Telephone Encounter (Signed)
Patient notified that medication assistance program for Ozempic was denied.

## 2022-08-05 NOTE — Telephone Encounter (Signed)
Currently the processing time for [employer] patient assistance program is 4 to 6 weeks.  Updated financial information was faxed 07/17/2022.  Expect determination between 08/07/2022 to 08/21/2022.  [employer] to check application status. Unfortunately medication assistance was denied.

## 2022-08-05 NOTE — Telephone Encounter (Signed)
Will forward to Clinic Pharmacist.

## 2022-08-05 NOTE — Telephone Encounter (Signed)
Patient called wondering when he'll be getting his shots that are being ordered from here. Please advise.

## 2022-08-17 ENCOUNTER — Other Ambulatory Visit: Payer: Self-pay | Admitting: Family Medicine

## 2022-08-17 DIAGNOSIS — E782 Mixed hyperlipidemia: Secondary | ICD-10-CM

## 2022-08-21 ENCOUNTER — Other Ambulatory Visit: Payer: Self-pay | Admitting: Family Medicine

## 2022-08-21 DIAGNOSIS — E1169 Type 2 diabetes mellitus with other specified complication: Secondary | ICD-10-CM

## 2022-08-21 DIAGNOSIS — R739 Hyperglycemia, unspecified: Secondary | ICD-10-CM

## 2022-10-25 ENCOUNTER — Ambulatory Visit: Payer: Managed Care, Other (non HMO) | Admitting: Family Medicine

## 2022-12-06 ENCOUNTER — Other Ambulatory Visit: Payer: Self-pay | Admitting: Family Medicine

## 2022-12-06 ENCOUNTER — Telehealth: Payer: Self-pay | Admitting: Family Medicine

## 2022-12-06 DIAGNOSIS — F411 Generalized anxiety disorder: Secondary | ICD-10-CM

## 2022-12-06 MED ORDER — ALPRAZOLAM 0.5 MG PO TABS: ORAL_TABLET | ORAL | 1 refills | Status: DC

## 2022-12-06 NOTE — Telephone Encounter (Signed)
Patient informed. 

## 2022-12-06 NOTE — Telephone Encounter (Signed)
Pt called to follow up on his refill for alprazolam. He is out. Pharmacy sent refill request today. Patient would like medication sent into today if possible please

## 2022-12-06 NOTE — Telephone Encounter (Signed)
Last RF--01/25/2022--#90 with 1 refill Last OV--07/05/2022

## 2022-12-06 NOTE — Telephone Encounter (Signed)
Just received refill request from pharmacy and sent that request to PCP.

## 2022-12-16 ENCOUNTER — Other Ambulatory Visit: Payer: Self-pay | Admitting: Family Medicine

## 2022-12-23 ENCOUNTER — Other Ambulatory Visit: Payer: Self-pay | Admitting: Family Medicine

## 2022-12-30 ENCOUNTER — Other Ambulatory Visit: Payer: Self-pay | Admitting: Family Medicine

## 2023-01-03 ENCOUNTER — Encounter: Payer: Self-pay | Admitting: Family Medicine

## 2023-01-03 ENCOUNTER — Ambulatory Visit (INDEPENDENT_AMBULATORY_CARE_PROVIDER_SITE_OTHER): Payer: Managed Care, Other (non HMO) | Admitting: Family Medicine

## 2023-01-03 VITALS — BP 120/78 | HR 79 | Temp 98.8°F | Ht 66.0 in | Wt 164.2 lb

## 2023-01-03 DIAGNOSIS — E1169 Type 2 diabetes mellitus with other specified complication: Secondary | ICD-10-CM | POA: Diagnosis not present

## 2023-01-03 DIAGNOSIS — E782 Mixed hyperlipidemia: Secondary | ICD-10-CM | POA: Diagnosis not present

## 2023-01-03 DIAGNOSIS — Z79899 Other long term (current) drug therapy: Secondary | ICD-10-CM

## 2023-01-03 DIAGNOSIS — E039 Hypothyroidism, unspecified: Secondary | ICD-10-CM

## 2023-01-03 DIAGNOSIS — E669 Obesity, unspecified: Secondary | ICD-10-CM

## 2023-01-03 DIAGNOSIS — F411 Generalized anxiety disorder: Secondary | ICD-10-CM | POA: Diagnosis not present

## 2023-01-03 DIAGNOSIS — Z7984 Long term (current) use of oral hypoglycemic drugs: Secondary | ICD-10-CM

## 2023-01-03 LAB — COMPREHENSIVE METABOLIC PANEL
ALT: 78 U/L — ABNORMAL HIGH (ref 0–53)
AST: 59 U/L — ABNORMAL HIGH (ref 0–37)
Albumin: 4.7 g/dL (ref 3.5–5.2)
Alkaline Phosphatase: 39 U/L (ref 39–117)
BUN: 15 mg/dL (ref 6–23)
CO2: 25 mEq/L (ref 19–32)
Calcium: 10.5 mg/dL (ref 8.4–10.5)
Chloride: 102 mEq/L (ref 96–112)
Creatinine, Ser: 1.16 mg/dL (ref 0.40–1.50)
GFR: 67.06 mL/min (ref >60.00)
Glucose, Bld: 88 mg/dL (ref 70–99)
Potassium: 4.4 mEq/L (ref 3.5–5.1)
Sodium: 138 mEq/L (ref 135–145)
Total Bilirubin: 0.7 mg/dL (ref 0.2–1.2)
Total Protein: 7.3 g/dL (ref 6.0–8.3)

## 2023-01-03 LAB — LIPID PANEL
Cholesterol: 127 mg/dL (ref 0–200)
HDL: 34.3 mg/dL — ABNORMAL LOW (ref >39.00)
LDL Cholesterol: 69 mg/dL (ref 0–99)
NonHDL: 92.92
Total CHOL/HDL Ratio: 4
Triglycerides: 122 mg/dL (ref 0.0–149.0)
VLDL: 24.4 mg/dL (ref 0.0–40.0)

## 2023-01-03 LAB — TSH: TSH: 0.26 u[IU]/mL — ABNORMAL LOW (ref 0.35–5.50)

## 2023-01-03 LAB — HEMOGLOBIN A1C: Hgb A1c MFr Bld: 6.1 % (ref 4.6–6.5)

## 2023-01-03 LAB — T4, FREE: Free T4: 1.24 ng/dL (ref 0.60–1.60)

## 2023-01-03 NOTE — Progress Notes (Signed)
Subjective:   Chief Complaint  Patient presents with   Follow-up    6 month    Jeffery Farrell is a 62 y.o. male here for follow-up of diabetes.   Jeffery Farrell's self monitored glucose range is 70-90's.  Patient denies hypoglycemic reactions. He checks his glucose levels 2 time(s) per week.  Patient does not require insulin.   Medications include: Amaryl 4 mg bid, metformin 1000 mg bid Diet is healthy.  Exercise: walking, active at work.   Mixed Hyperlipidemia Patient presents for hyperlipidemia follow up. Currently being treated with Zetia 10 mg/d, Lipitor 80 mg/d, Tricor 145 mg/d and compliance with treatment thus far has been good. He denies myalgias. Diet/exercise as above.  The patient is not known to have coexisting coronary artery disease.  Hypothyroidism Patient presents for follow-up of hypothyroidism.  Reports compliance with medication- levothyroxine 150 mcg/d. Current symptoms include: denies fatigue, weight changes, heat/cold intolerance, bowel/skin changes or CVS symptoms He believes his dose should be not significantly changed  GAD Hx of GAD, intermittently takes Xanax 0.5 mg prn. No AE's, not taking daily- 2/3 times per mo. Family stressors will flare his s/s's. Not following w counselor/therapist. No SI or HI. No self medication.   Past Medical History:  Diagnosis Date   Diabetes mellitus type 2 in obese ([facility])    Hyperlipidemia    Hypertension      Related testing: Retinal exam: Done Pneumovax: done  Objective:  BP 120/78 (BP Location: Left Arm, Patient Position: Sitting, Cuff Size: Normal)   Pulse 79   Temp 98.8 F (37.1 C) (Oral)   Ht 5\' 6"  (1.676 m)   Wt 164 lb 4 oz (74.5 kg)   SpO2 99%   BMI 26.51 kg/m  General:  Well developed, well nourished, in no apparent distress Skin:  Warm, no pallor or diaphoresis Head:  Normocephalic, atraumatic Eyes:  Pupils equal and round, sclera anicteric without injection  Lungs:  CTAB, no access msc use Cardio:  RRR,  no bruits, no LE edema Musculoskeletal:  Symmetrical muscle groups noted without atrophy or deformity Neuro:  Sensation intact to pinprick on feet Psych: Age appropriate judgment and insight  Assessment:   Type 2 diabetes mellitus with obesity (HCC)  Mixed hyperlipidemia  Hypothyroidism, unspecified type  GAD (generalized anxiety disorder)   Plan:   Chronic, stable. Cont Amaryl 4 mg bid, metformin 1000 mg bid. Counseled on diet and exercise. Chronic, stable. Cont Lipitor 80 mg/d, Zetia 10 mg/d, Tricor 145 mg/d. Chronic, stable. Cont levothyroxine 150 mcg/d. Chronic, stable. Cont Xanax prn. Update CSC and UDS today.  F/u in 6 mo. The patient voiced understanding and agreement to the plan.  Jeffery Roche [place], DO 01/03/23 7:33 AM

## 2023-01-03 NOTE — Patient Instructions (Signed)
Give us 2-3 business days to get the results of your labs back.   Keep the diet clean and stay active.  Let us know if you need anything. 

## 2023-01-05 LAB — DRUG MONITORING PANEL 376104, URINE
Amphetamines: NEGATIVE ng/mL (ref <500)
Barbiturates: NEGATIVE ng/mL (ref <300)
Benzodiazepines: NEGATIVE ng/mL (ref <100)
Cocaine Metabolite: NEGATIVE ng/mL (ref <150)
Desmethyltramadol: NEGATIVE ng/mL (ref <100)
Opiates: NEGATIVE ng/mL (ref <100)
Oxycodone: NEGATIVE ng/mL (ref <100)
Tramadol: NEGATIVE ng/mL (ref <100)

## 2023-01-05 LAB — DM TEMPLATE

## 2023-01-10 ENCOUNTER — Telehealth: Payer: Self-pay | Admitting: Family Medicine

## 2023-01-10 NOTE — Telephone Encounter (Signed)
Pt dropped off Wellness Screening Form to be filled out by PCP. Pt asks to be called when form is complete and has been faxed.Form has been placed in PCP's tray in front office.

## 2023-01-10 NOTE — Telephone Encounter (Signed)
Form completed. PCP signed and faxed to [phone] Received fax confirmation Patient aware

## 2023-01-17 ENCOUNTER — Other Ambulatory Visit: Payer: Self-pay | Admitting: Family Medicine

## 2023-03-27 ENCOUNTER — Other Ambulatory Visit: Payer: Self-pay | Admitting: Family Medicine

## 2023-03-27 DIAGNOSIS — R739 Hyperglycemia, unspecified: Secondary | ICD-10-CM

## 2023-03-27 DIAGNOSIS — E1169 Type 2 diabetes mellitus with other specified complication: Secondary | ICD-10-CM

## 2023-04-04 ENCOUNTER — Other Ambulatory Visit: Payer: Self-pay | Admitting: Family Medicine

## 2023-05-27 ENCOUNTER — Other Ambulatory Visit: Payer: Self-pay | Admitting: Family Medicine

## 2023-05-27 DIAGNOSIS — E782 Mixed hyperlipidemia: Secondary | ICD-10-CM

## 2023-06-09 ENCOUNTER — Other Ambulatory Visit: Payer: Self-pay | Admitting: Family Medicine

## 2023-06-27 ENCOUNTER — Other Ambulatory Visit: Payer: Self-pay | Admitting: Family Medicine

## 2023-07-11 ENCOUNTER — Encounter: Payer: Self-pay | Admitting: Family Medicine

## 2023-07-11 ENCOUNTER — Other Ambulatory Visit: Payer: Self-pay | Admitting: Family Medicine

## 2023-07-11 ENCOUNTER — Ambulatory Visit (INDEPENDENT_AMBULATORY_CARE_PROVIDER_SITE_OTHER): Payer: Managed Care, Other (non HMO) | Admitting: Family Medicine

## 2023-07-11 VITALS — BP 126/78 | HR 87 | Temp 98.0°F | Resp 16 | Ht 66.0 in | Wt 170.6 lb

## 2023-07-11 DIAGNOSIS — E039 Hypothyroidism, unspecified: Secondary | ICD-10-CM

## 2023-07-11 DIAGNOSIS — E669 Obesity, unspecified: Secondary | ICD-10-CM

## 2023-07-11 DIAGNOSIS — Z125 Encounter for screening for malignant neoplasm of prostate: Secondary | ICD-10-CM

## 2023-07-11 DIAGNOSIS — F411 Generalized anxiety disorder: Secondary | ICD-10-CM

## 2023-07-11 DIAGNOSIS — E1169 Type 2 diabetes mellitus with other specified complication: Secondary | ICD-10-CM

## 2023-07-11 DIAGNOSIS — Z0000 Encounter for general adult medical examination without abnormal findings: Secondary | ICD-10-CM

## 2023-07-11 DIAGNOSIS — Z7984 Long term (current) use of oral hypoglycemic drugs: Secondary | ICD-10-CM

## 2023-07-11 LAB — CBC
HCT: 39.6 % (ref 39.0–52.0)
Hemoglobin: 13.5 g/dL (ref 13.0–17.0)
MCHC: 34.1 g/dL (ref 30.0–36.0)
MCV: 94.2 fL (ref 78.0–100.0)
Platelets: 356 10*3/uL (ref 150.0–400.0)
RBC: 4.2 Mil/uL — ABNORMAL LOW (ref 4.22–5.81)
RDW: 13.7 % (ref 11.5–15.5)
WBC: 6.1 10*3/uL (ref 4.0–10.5)

## 2023-07-11 LAB — MICROALBUMIN / CREATININE URINE RATIO
Creatinine,U: 114.9 mg/dL
Microalb Creat Ratio: 0.6 mg/g (ref 0.0–30.0)
Microalb, Ur: <0.7 mg/dL (ref 0.0–1.9)

## 2023-07-11 LAB — COMPREHENSIVE METABOLIC PANEL
ALT: 78 U/L — ABNORMAL HIGH (ref 0–53)
AST: 65 U/L — ABNORMAL HIGH (ref 0–37)
Albumin: 4.6 g/dL (ref 3.5–5.2)
Alkaline Phosphatase: 37 U/L — ABNORMAL LOW (ref 39–117)
BUN: 9 mg/dL (ref 6–23)
CO2: 27 meq/L (ref 19–32)
Calcium: 9.8 mg/dL (ref 8.4–10.5)
Chloride: 104 meq/L (ref 96–112)
Creatinine, Ser: 1.13 mg/dL (ref 0.40–1.50)
GFR: 68.95 mL/min (ref >60.00)
Glucose, Bld: 110 mg/dL — ABNORMAL HIGH (ref 70–99)
Potassium: 4.6 meq/L (ref 3.5–5.1)
Sodium: 140 meq/L (ref 135–145)
Total Bilirubin: 0.9 mg/dL (ref 0.2–1.2)
Total Protein: 7.1 g/dL (ref 6.0–8.3)

## 2023-07-11 LAB — LIPID PANEL
Cholesterol: 138 mg/dL (ref 0–200)
HDL: 36.2 mg/dL — ABNORMAL LOW (ref >39.00)
LDL Cholesterol: 80 mg/dL (ref 0–99)
NonHDL: 101.86
Total CHOL/HDL Ratio: 4
Triglycerides: 110 mg/dL (ref 0.0–149.0)
VLDL: 22 mg/dL (ref 0.0–40.0)

## 2023-07-11 LAB — PSA: PSA: 0.23 ng/mL (ref 0.10–4.00)

## 2023-07-11 LAB — HEMOGLOBIN A1C: Hgb A1c MFr Bld: 6.8 % — ABNORMAL HIGH (ref 4.6–6.5)

## 2023-07-11 MED ORDER — METFORMIN HCL ER 500 MG PO TB24: 1000.0000 mg | ORAL_TABLET | Freq: Two times a day (BID) | ORAL | 2 refills | Status: AC

## 2023-07-11 MED ORDER — EZETIMIBE 10 MG PO TABS: 10.0000 mg | ORAL_TABLET | Freq: Every day | ORAL | 3 refills | Status: AC

## 2023-07-11 NOTE — Progress Notes (Signed)
 Chief Complaint  Patient presents with   Annual Exam    Annual Exam    Well Male Jeffery Farrell is here for a complete physical.   His last physical was >1 year ago.  Current diet: in general, a healthy diet.  Current exercise: walking Weight trend: stable Fatigue out of ordinary? No. Seat belt? Yes.   Advanced directive? No  Health maintenance Shingrix- No Colonoscopy- Yes Tetanus- Yes HIV- Yes Hep C- Yes Lung cancer screening- Yes   Past Medical History:  Diagnosis Date   Diabetes mellitus type 2 in obese    Hyperlipidemia    Hypertension       Past Surgical History:  Procedure Laterality Date   COLONOSCOPY  2012   In [place]-normal exam per pt   VASECTOMY      Medications  Current Outpatient Medications on File Prior to Visit  Medication Sig Dispense Refill   ALPRAZolam  (XANAX ) 0.5 MG tablet TAKE 1 TABLET BY MOUTH EVERY DAY AS NEEDED FOR ANXIETY 90 tablet 1   atorvastatin  (LIPITOR) 80 MG tablet Take 1 tablet (80 mg total) by mouth daily. 90 tablet 0   cholecalciferol (VITAMIN D3) 25 MCG (1000 UNIT) tablet Take 1,000 Units by mouth daily.     cyanocobalamin (VITAMIN B12) 1000 MCG tablet Take 1,000 mcg by mouth daily.     ezetimibe  (ZETIA ) 10 MG tablet TAKE 1 TABLET BY MOUTH EVERY DAY 30 tablet 3   fenofibrate  (TRICOR ) 145 MG tablet Take 1 tablet (145 mg total) by mouth daily. 90 tablet 1   glimepiride  (AMARYL ) 4 MG tablet TAKE 1 TABLET BY MOUTH 2 TIMES A DAY FORBLOOD SUGAR 180 tablet 1   levothyroxine  (SYNTHROID ) 150 MCG tablet TAKE 1 TABLET BY MOUTH EVERY DAY 90 tablet 3   magnesium  oxide (MAG-OX) 400 (240 Mg) MG tablet TAKE 1 TABLET BY MOUTH TWICE DAILY 60 tablet 1   metFORMIN  (GLUCOPHAGE ) 1000 MG tablet Take 1 tablet (1,000 mg total) by mouth 2 (two) times daily with a meal. 180 tablet 0   omeprazole (PRILOSEC) 40 MG capsule Take 1 capsule (40 mg total) by mouth 2 (two) times daily before a meal. 180 capsule 1    Allergies Allergies  Allergen  Reactions   Metformin  Other (See Comments)    GI issues GI issues    Indomethacin Other (See Comments)    GI issues     Family History Family History  Problem Relation Age of Onset   Colon cancer Neg Hx    Esophageal cancer Neg Hx    Rectal cancer Neg Hx    Colon polyps Neg Hx     Review of Systems: Constitutional:  no fevers Eye:  no recent significant change in vision Ear/Nose/Mouth/Throat:  Ears:  no hearing loss Nose/Mouth/Throat:  no complaints of nasal congestion, no sore throat Cardiovascular:  no chest pain Respiratory:  no shortness of breath Gastrointestinal:  +intermittent diarrhea GU:  Male: negative for dysuria, frequency Musculoskeletal/Extremities:  no joint pain Integumentary (Skin/Breast):  no abnormal skin lesions reported Neurologic:  no headaches Endocrine: No unexpected weight changes Hematologic/Lymphatic:  no abnormal bleeding  Exam BP 126/78   Pulse 87   Temp 98 F (36.7 C) (Oral)   Resp 16   Ht 5' 6 (1.676 m)   Wt 170 lb 9.6 oz (77.4 kg)   SpO2 98%   BMI 27.54 kg/m  General:  well developed, well nourished, in no apparent distress Skin:  no significant moles, warts, or growths Head:  no masses, lesions, or tenderness Eyes:  pupils equal and round, sclera anicteric without injection Ears:  canals without lesions, TMs shiny without retraction, no obvious effusion, no erythema Nose:  nares patent, mucosa normal Throat/Pharynx:  lips and gingiva without lesion; tongue and uvula midline; non-inflamed pharynx; no exudates or postnasal drainage Neck: neck supple without adenopathy, thyromegaly, or masses Cardiac: RRR, no bruits, no LE edema Lungs:  clear to auscultation, breath sounds equal bilaterally, no respiratory distress Abdomen: BS+, soft, non-tender, non-distended, no masses or organomegaly noted Rectal: Deferred Musculoskeletal:  symmetrical muscle groups noted without atrophy or deformity Neuro:  gait normal; deep tendon reflexes  normal and symmetric Psych: well oriented with normal range of affect and appropriate judgment/insight  Assessment and Plan  Well adult exam - Plan: CBC, Comprehensive metabolic panel, Lipid panel  Type 2 diabetes mellitus with obesity (HCC) - Plan: Hemoglobin A1c, Microalbumin / creatinine urine ratio  Screening for prostate cancer - Plan: PSA   Well 63 y.o. male. Counseled on diet and exercise. Counseled on risks and benefits of prostate cancer screening with PSA. The patient agrees to undergo testing. Advanced directive form provided today.  Shingrix rec'd.  Immunizations, labs, and further orders as above. Changed metformin  to XR 1000 mg bid given diarrhea.  Follow up in 6 mo. The patient voiced understanding and agreement to the plan.  Mabel [place], DO 07/11/23 9:09 AM

## 2023-07-11 NOTE — Patient Instructions (Signed)

## 2023-09-26 ENCOUNTER — Other Ambulatory Visit: Payer: Self-pay | Admitting: Family Medicine

## 2023-09-26 DIAGNOSIS — E1169 Type 2 diabetes mellitus with other specified complication: Secondary | ICD-10-CM

## 2023-09-26 DIAGNOSIS — R739 Hyperglycemia, unspecified: Secondary | ICD-10-CM

## 2023-10-19 IMAGING — US US ABDOMEN LIMITED
1 series · 14 of 25 positions shown · non-contrast
Comparison: None Available.

CLINICAL DATA: Elevated LFTs

EXAM:
ULTRASOUND ABDOMEN LIMITED RIGHT UPPER QUADRANT

[Series 1: us abdomen limited · 14 of 49 slices shown]
[im 1/49]
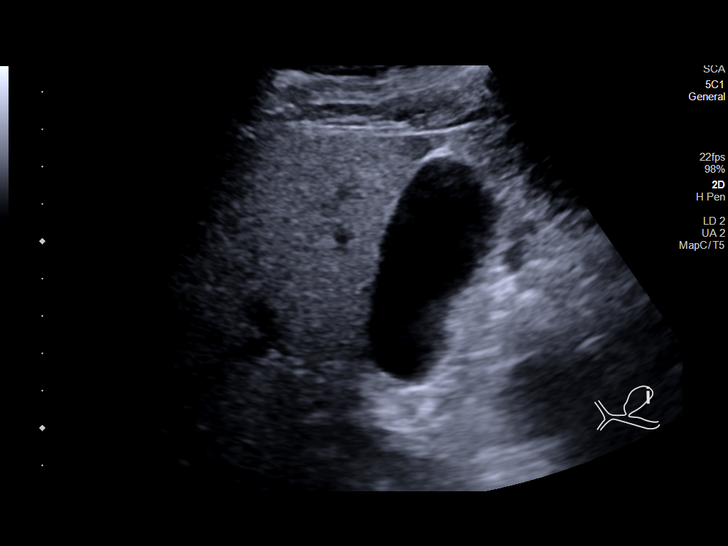
[im 5/49]
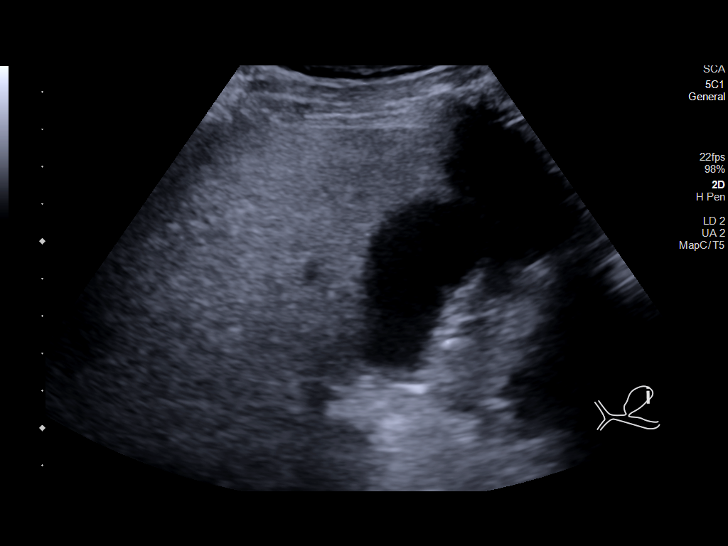
[im 9/49]
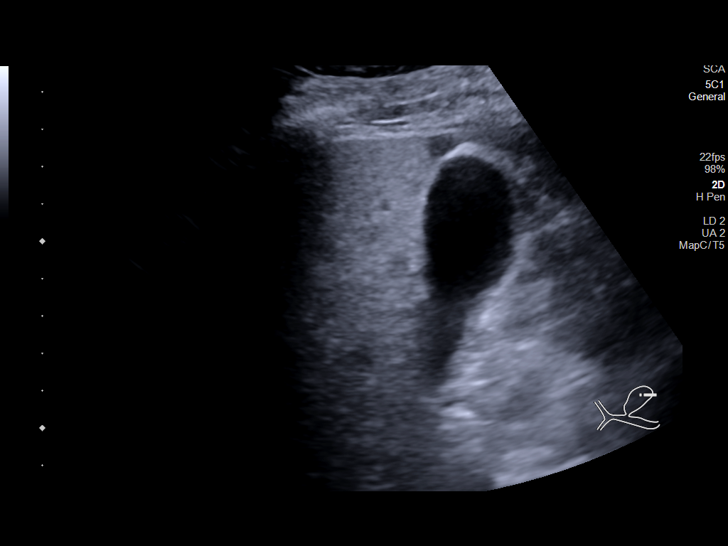
[im 13/49]
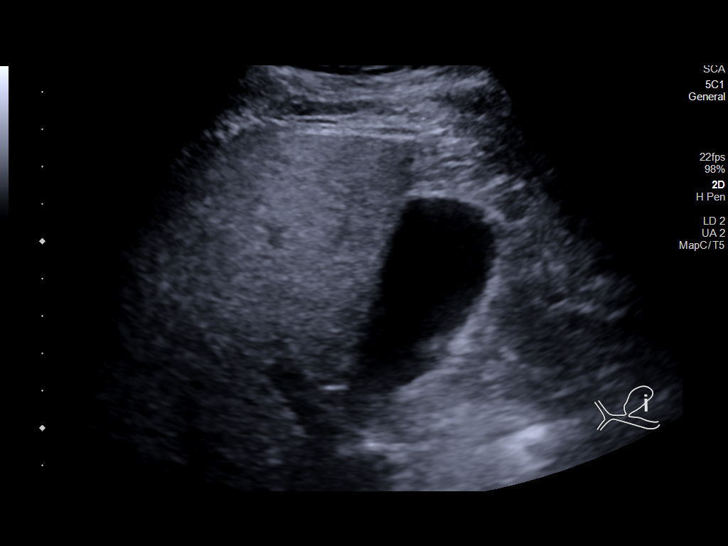
[im 17/49]
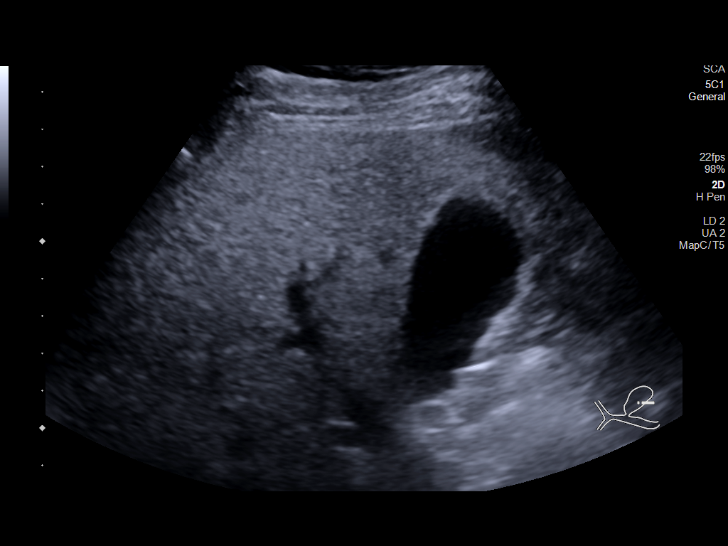
[im 19/49]
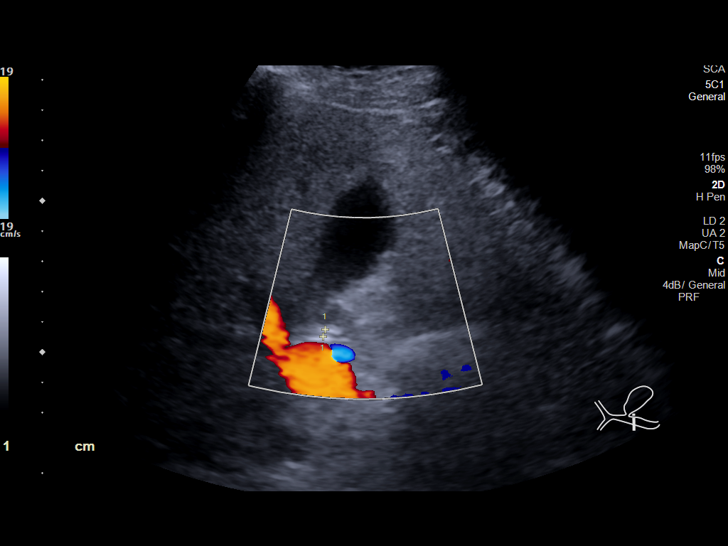
[im 23/49]
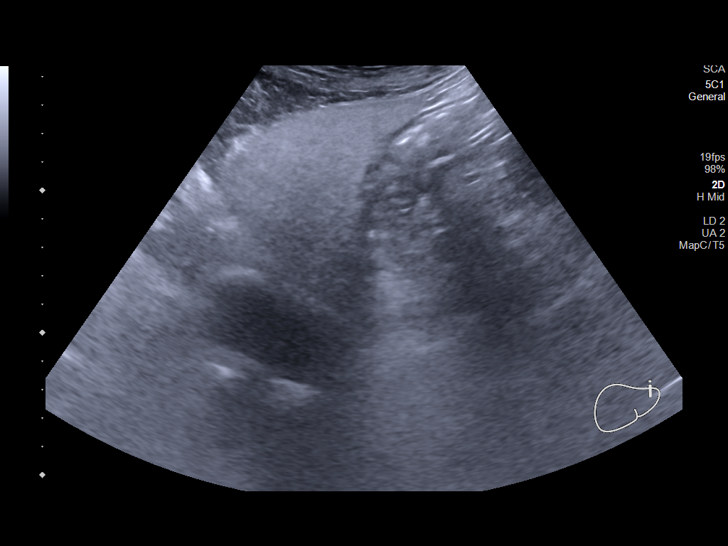
[im 27/49]
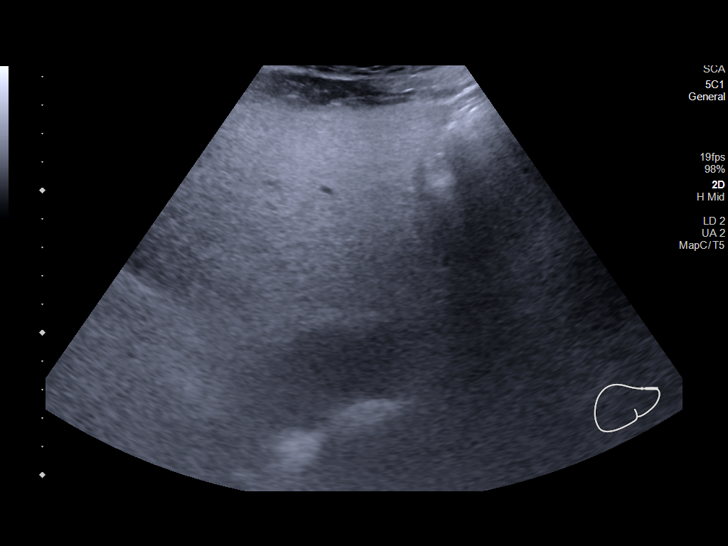
[im 31/49]
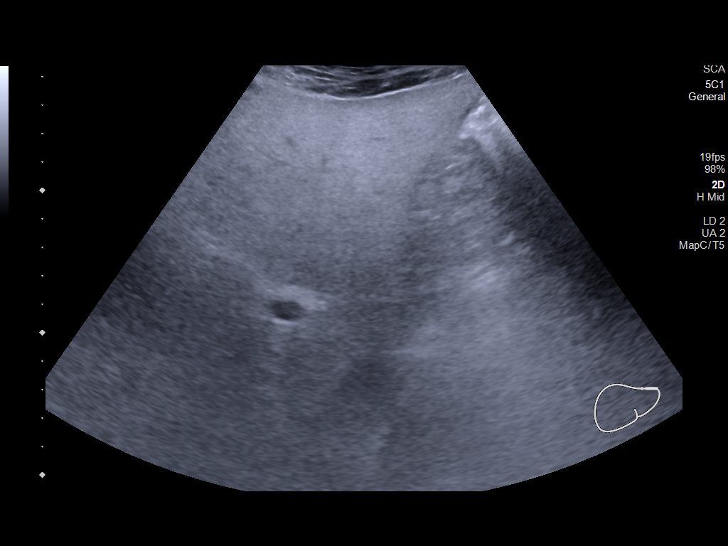
[im 33/49]
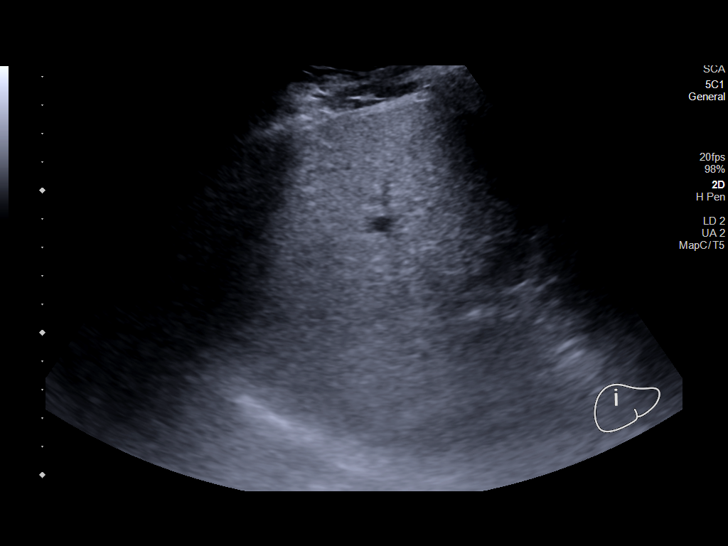
[im 37/49]
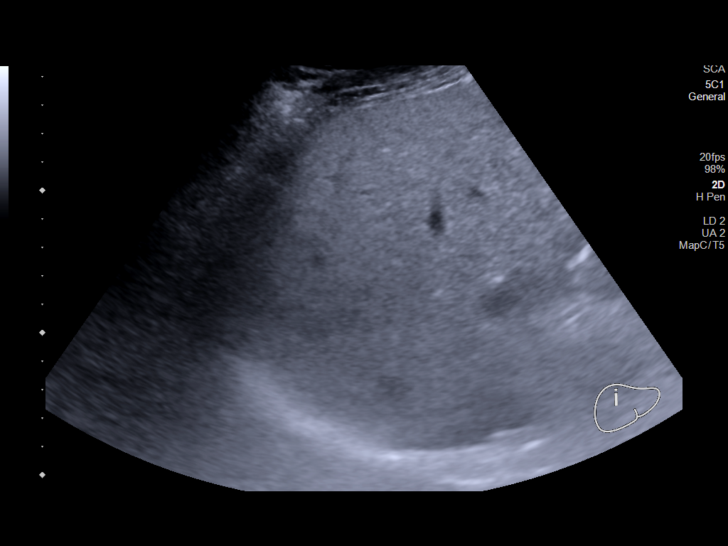
[im 41/49]
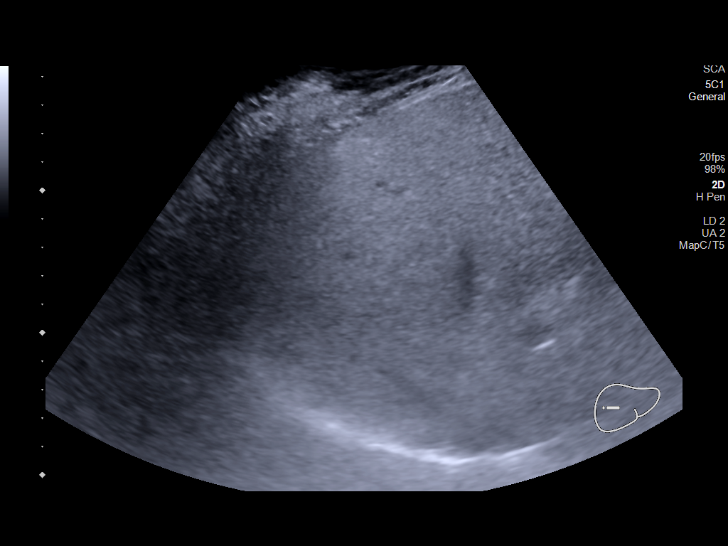
[im 45/49]
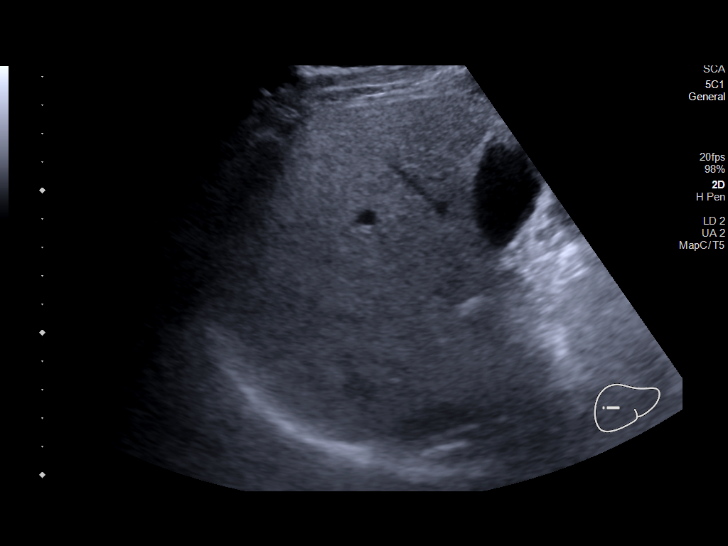
[im 49/49]
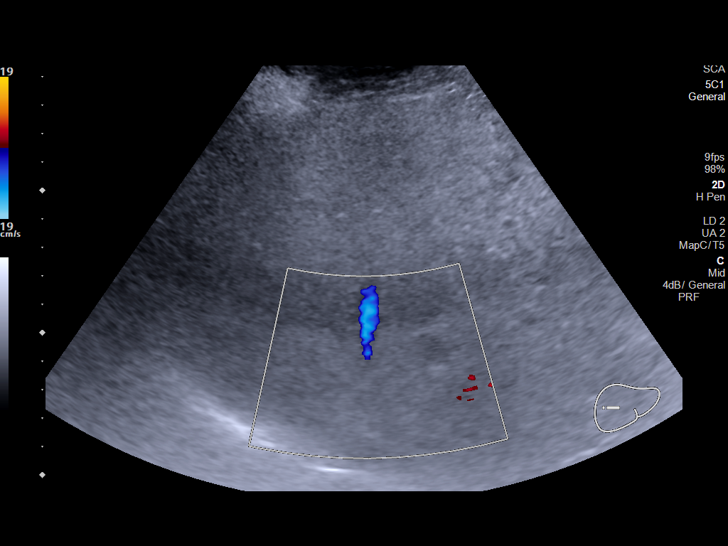

[14 of 25 positions shown; findings below may reference images not displayed]

FINDINGS: Gallbladder:

No gallstones or wall thickening visualized. No sonographic Murphy
sign noted by sonographer.

Common bile duct:

Diameter: 3 mm

Liver:

Coarse, increased echogenicity of the parenchyma no focal mass
identified. Portal vein is patent on color Doppler imaging with
normal direction of blood flow towards the liver.

Other: None.
IMPRESSION: Abnormal appearance of the liver parenchyma suggesting hepatic
steatosis and/or other hepatocellular disease.

## 2023-10-29 ENCOUNTER — Other Ambulatory Visit: Payer: Self-pay | Admitting: Family Medicine

## 2023-11-26 ENCOUNTER — Other Ambulatory Visit: Payer: Self-pay | Admitting: Family Medicine

## 2023-12-29 ENCOUNTER — Encounter: Payer: Self-pay | Admitting: Medical

## 2023-12-29 ENCOUNTER — Ambulatory Visit (INDEPENDENT_AMBULATORY_CARE_PROVIDER_SITE_OTHER): Admitting: Medical

## 2023-12-29 VITALS — BP 140/80 | HR 100 | Temp 97.7°F | Resp 18 | Ht 66.0 in | Wt 168.0 lb

## 2023-12-29 DIAGNOSIS — H1030 Unspecified acute conjunctivitis, unspecified eye: Secondary | ICD-10-CM | POA: Diagnosis not present

## 2023-12-29 DIAGNOSIS — R059 Cough, unspecified: Secondary | ICD-10-CM | POA: Diagnosis not present

## 2023-12-29 DIAGNOSIS — J029 Acute pharyngitis, unspecified: Secondary | ICD-10-CM | POA: Diagnosis not present

## 2023-12-29 LAB — POC COVID19 BINAXNOW: SARS Coronavirus 2 Ag: NEGATIVE

## 2023-12-29 LAB — POCT RAPID STREP A (OFFICE): Rapid Strep A Screen: NEGATIVE

## 2023-12-29 MED ORDER — FLUTICASONE PROPIONATE 50 MCG/ACT NA SUSP: 2.0000 | Freq: Every day | NASAL | 1 refills | Status: AC

## 2023-12-29 MED ORDER — TOBRAMYCIN 0.3 % OP SOLN: 2.0000 [drp] | Freq: Four times a day (QID) | OPHTHALMIC | 0 refills | Status: AC

## 2023-12-29 MED ORDER — AZITHROMYCIN 250 MG PO TABS: ORAL_TABLET | ORAL | 0 refills | Status: AC

## 2023-12-29 NOTE — Progress Notes (Unsigned)
 Subjective:    Patient ID: Jeffery Farrell, male    DOB: 1961-02-23, 63 y.o.   MRN: [id number]  HPI  Jeffery Farrell is a 63 year old male who presents with a sore throat and congestion.  He has been experiencing symptoms for the past week, with a notable worsening around Thursday. His symptoms include body aches, severe sore throat, congestion, and difficulty sleeping. He has lost three pounds due to difficulty eating and swallowing.  He underwent a rapid strep test and a COVID test, both of which were negative. His wife and grandson had similar symptoms recently, with his grandson experiencing a sore throat the week before last.  He reports significant congestion and mucus production, with a yellow discharge from his eyes. The sore throat is described as feeling like 'someone was touching my tongue and it just poured under my roots,' and swallowing saliva is painful. He has not been eating well due to the pain associated with swallowing. No skin rash.      Review of Systems  Constitutional:  Positive for fatigue. Negative for chills and fever.  HENT:  Positive for congestion and sore throat.   Respiratory:  Positive for cough. Negative for chest tightness and wheezing.   Cardiovascular:  Negative for chest pain and palpitations.  Gastrointestinal:  Negative for abdominal pain and nausea.  Genitourinary:  Negative for dysuria and hematuria.  Musculoskeletal:  Positive for myalgias.  Skin:  Negative for rash.  Neurological:  Negative for dizziness and numbness.  Hematological:  Negative for adenopathy. Does not bruise/bleed easily.  Psychiatric/Behavioral:  Negative for behavioral problems and dysphoric mood.     Past Medical History:  Diagnosis Date   Diabetes mellitus type 2 in obese    Hyperlipidemia    Hypertension      Social History   Socioeconomic History   Marital status: Married    Spouse name: Not on file   Number of children: Not on file   Years of education: Not on  file   Highest education level: Not on file  Occupational History   Not on file  Tobacco Use   Smoking status: Every Day    Current packs/day: 0.10    Types: Cigarettes   Smokeless tobacco: Never  Vaping Use   Vaping status: Every Day   Substances: Nicotine  Substance and Sexual Activity   Alcohol use: Yes    Alcohol/week: 7.0 standard drinks of alcohol    Types: 7 Cans of beer per week   Drug use: Never   Sexual activity: Not on file  Other Topics Concern   Not on file  Social History Narrative   Not on file   Social Drivers of Health   Financial Resource Strain: Not on file  Food Insecurity: Not on file  Transportation Needs: Not on file  Physical Activity: Not on file  Stress: Not on file  Social Connections: Not on file  Intimate Partner Violence: Not on file    Past Surgical History:  Procedure Laterality Date   COLONOSCOPY  2012   In [place]-normal exam per pt   VASECTOMY      Family History  Problem Relation Age of Onset   Colon cancer Neg Hx    Esophageal cancer Neg Hx    Rectal cancer Neg Hx    Colon polyps Neg Hx     Allergies  Allergen Reactions   Metformin  Other (See Comments)    GI issues GI issues    Indomethacin Other (  See Comments)    GI issues     Current Outpatient Medications on File Prior to Visit  Medication Sig Dispense Refill   ALPRAZolam  (XANAX ) 0.5 MG tablet TAKE 1 TABLET BY MOUTH EVERY DAY AS NEEDED FOR ANXIETY 90 tablet 1   atorvastatin  (LIPITOR) 80 MG tablet TAKE 1 TABLET BY MOUTH EVERY DAY FOR CHOLESTEROL 90 tablet 0   cholecalciferol (VITAMIN D3) 25 MCG (1000 UNIT) tablet Take 1,000 Units by mouth daily.     cyanocobalamin (VITAMIN B12) 1000 MCG tablet Take 1,000 mcg by mouth daily.     ezetimibe  (ZETIA ) 10 MG tablet Take 1 tablet (10 mg total) by mouth daily. 90 tablet 3   fenofibrate  (TRICOR ) 145 MG tablet Take 1 tablet (145 mg total) by mouth daily. 90 tablet 1   glimepiride  (AMARYL ) 4 MG tablet TAKE 1 TABLET  BY MOUTH 2 TIMES A DAY FORBLOOD SUGAR 180 tablet 1   levothyroxine  (SYNTHROID ) 150 MCG tablet TAKE 1 TABLET BY MOUTH EVERY DAY 90 tablet 3   magnesium  oxide (MAG-OX) 400 (240 Mg) MG tablet TAKE 1 TABLET BY MOUTH TWICE DAILY 60 tablet 1   metFORMIN  (GLUCOPHAGE -XR) 500 MG 24 hr tablet Take 2 tablets (1,000 mg total) by mouth 2 (two) times daily with a meal. 360 tablet 2   omeprazole (PRILOSEC) 40 MG capsule Take 1 capsule (40 mg total) by mouth 2 (two) times daily before a meal. 180 capsule 1   No current facility-administered medications on file prior to visit.    BP (!) 150/90   Pulse 100   Temp 97.7 F (36.5 C)   Resp 18   Ht 5' 6 (1.676 m)   Wt 168 lb (76.2 kg)   SpO2 98%   BMI 27.12 kg/m        Objective:   Physical Exam  General Mental Status- Alert. General Appearance- Not in acute distress.   Skin General: Color- Normal Color. Moisture- Normal Moisture.  Neck Carotid Arteries- Normal color. Moisture- Normal Moisture. No carotid bruits. No JVD.  Chest and Lung Exam Auscultation: Breath Sounds:-CTA  Cardiovascular Auscultation:Rythm- Regular. Murmurs & Other Heart Sounds:Auscultation of the heart reveals- No Murmurs.  Abdomen Inspection:-Inspeection Normal. Palpation/Percussion:Note:No mass. Palpation and Percussion of the abdomen reveal- Non Tender, Non Distended + BS, no rebound or guarding.    Neurologic Cranial Nerve exam:- CN III-XII intact(No nystagmus), symmetric smile. Drift Test:- No drift. Romberg Exam:- Negative.  Heal to Toe Gait exam:-Normal. Finger to Nose:- Normal/Intact Strength:- 5/5 equal and symmetric strength both upper and lower extremities.       Assessment & Plan:   Patient Instructions  Acute Pharyngitis Suspected bacterial pharyngitis due to symptom severity and appearance.. Negative rapid strep test.(Decide send out not indicated/required) - Prescribed azithromycin for 5 days. - Recommended Tylenol 500 mg and ibuprofen 200  mg for throat pain, with caution due to borderline hypertension. - Advised follow-up if symptoms do not improve by Wednesday.  Bacterial Conjunctivitis Yellow discharge and eyelid matting indicate bacterial conjunctivitis. - Prescribed Tobrex  eye drops, 2 drops every 8 hours.  Nasal congestion and some intemittent productve cough -flonase -advised if with above not a lot better by wed then get cxr  Hypertension Blood pressure is borderline. Advised low-dose ibuprofen due to potential for NSAIDs to elevate blood pressure. - Use low-dose ibuprofen. -continue bp meds.  Follow up 7 days or sooner if needed

## 2023-12-29 NOTE — Patient Instructions (Addendum)
 Acute Pharyngitis Suspected bacterial pharyngitis due to symptom severity and appearance.. Negative rapid strep test.(Decide send out not indicated/required) - Prescribed azithromycin for 5 days. - Recommended Tylenol 500 mg and ibuprofen 200 mg for throat pain, with caution due to borderline hypertension. - Advised follow-up if symptoms do not improve by Wednesday.  Bacterial Conjunctivitis Yellow discharge and eyelid matting indicate bacterial conjunctivitis. - Prescribed Tobrex  eye drops, 2 drops every 8 hours.  Nasal congestion and some intemittent productve cough -flonase -advised if with above not a lot better by wed then get cxr  Hypertension Blood pressure is borderline. Advised low-dose ibuprofen due to potential for NSAIDs to elevate blood pressure. - Use low-dose ibuprofen. -continue bp meds.  Follow up 7 days or sooner if needed

## 2024-01-01 ENCOUNTER — Other Ambulatory Visit: Payer: Self-pay | Admitting: Family Medicine

## 2024-01-01 ENCOUNTER — Telehealth: Payer: Self-pay

## 2024-01-01 DIAGNOSIS — E782 Mixed hyperlipidemia: Secondary | ICD-10-CM

## 2024-01-01 NOTE — Telephone Encounter (Signed)
 Pt notified xray is walkin

## 2024-01-01 NOTE — Telephone Encounter (Signed)
 Copied from CRM [id number]. Topic: Clinical - Request for Lab/Test Order >> Jan 01, 2024  7:33 AM Robinson DEL wrote: Reason for CRM: Patient states he was in to see Dallas Maxwell on 6/23 and mentioned that he possibly was going to do a chest X-ray on patient for issues he was having. States he still has the cough it's a little better but wants to have the chest X-ray done if he can have it done on Friday after 12pm would be great since he has work, if not he'll go when he has to.  [place] [phone]

## 2024-01-02 ENCOUNTER — Ambulatory Visit (HOSPITAL_BASED_OUTPATIENT_CLINIC_OR_DEPARTMENT_OTHER)
Admission: RE | Admit: 2024-01-02 | Discharge: 2024-01-02 | Disposition: A | Discharge status: Home / Self Care | Source: Ambulatory Visit | Attending: Medical | Admitting: Medical

## 2024-01-02 ENCOUNTER — Ambulatory Visit: Payer: Self-pay | Admitting: Medical

## 2024-01-02 DIAGNOSIS — R059 Cough, unspecified: Secondary | ICD-10-CM | POA: Diagnosis present

## 2024-01-05 ENCOUNTER — Telehealth: Payer: Self-pay

## 2024-01-05 NOTE — Telephone Encounter (Signed)
Pt seen by Percell Miller

## 2024-01-05 NOTE — Telephone Encounter (Signed)
 Copied from CRM [id number]. Topic: Clinical - Lab/Test Results >> Jan 05, 2024  3:20 PM Berneda FALCON wrote: Reason for CRM: Pt states he would like the results of the chest xray discussed with him via phone call please.  Pt callback is [phone]

## 2024-01-06 NOTE — Telephone Encounter (Signed)
 Patient was advised of Edward Saguier,PA-C message below and verbalized understanding.    Chest xray clear/no pneumonia.  Written by Dallas Maxwell, PA-C on 01/02/2024  1:52 PM EDT

## 2024-01-15 ENCOUNTER — Other Ambulatory Visit: Payer: Self-pay | Admitting: Family Medicine

## 2024-01-15 DIAGNOSIS — F411 Generalized anxiety disorder: Secondary | ICD-10-CM

## 2024-01-15 NOTE — Telephone Encounter (Signed)
 Appt tomorrow.   Last refill 07/14/23 #90 and 1RF

## 2024-01-16 ENCOUNTER — Ambulatory Visit: Payer: Self-pay | Admitting: Family Medicine

## 2024-01-16 ENCOUNTER — Encounter: Payer: Self-pay | Admitting: Family Medicine

## 2024-01-16 ENCOUNTER — Ambulatory Visit (INDEPENDENT_AMBULATORY_CARE_PROVIDER_SITE_OTHER): Payer: Managed Care, Other (non HMO) | Admitting: Family Medicine

## 2024-01-16 VITALS — BP 128/86 | HR 84 | Temp 98.0°F | Resp 16 | Ht 66.0 in | Wt 167.0 lb

## 2024-01-16 DIAGNOSIS — Z79899 Other long term (current) drug therapy: Secondary | ICD-10-CM

## 2024-01-16 DIAGNOSIS — E782 Mixed hyperlipidemia: Secondary | ICD-10-CM

## 2024-01-16 DIAGNOSIS — Z7984 Long term (current) use of oral hypoglycemic drugs: Secondary | ICD-10-CM

## 2024-01-16 DIAGNOSIS — E039 Hypothyroidism, unspecified: Secondary | ICD-10-CM | POA: Diagnosis not present

## 2024-01-16 DIAGNOSIS — E1169 Type 2 diabetes mellitus with other specified complication: Secondary | ICD-10-CM | POA: Diagnosis not present

## 2024-01-16 DIAGNOSIS — F411 Generalized anxiety disorder: Secondary | ICD-10-CM | POA: Diagnosis not present

## 2024-01-16 DIAGNOSIS — E669 Obesity, unspecified: Secondary | ICD-10-CM | POA: Diagnosis not present

## 2024-01-16 DIAGNOSIS — Z6826 Body mass index (BMI) 26.0-26.9, adult: Secondary | ICD-10-CM

## 2024-01-16 LAB — COMPREHENSIVE METABOLIC PANEL WITH GFR
ALT: 51 U/L (ref 0–53)
AST: 42 U/L — ABNORMAL HIGH (ref 0–37)
Albumin: 4.7 g/dL (ref 3.5–5.2)
Alkaline Phosphatase: 39 U/L (ref 39–117)
BUN: 17 mg/dL (ref 6–23)
CO2: 28 meq/L (ref 19–32)
Calcium: 10.5 mg/dL (ref 8.4–10.5)
Chloride: 100 meq/L (ref 96–112)
Creatinine, Ser: 1.19 mg/dL (ref 0.40–1.50)
GFR: 64.56 mL/min (ref >60.00)
Glucose, Bld: 110 mg/dL — ABNORMAL HIGH (ref 70–99)
Potassium: 4.8 meq/L (ref 3.5–5.1)
Sodium: 136 meq/L (ref 135–145)
Total Bilirubin: 0.9 mg/dL (ref 0.2–1.2)
Total Protein: 7.4 g/dL (ref 6.0–8.3)

## 2024-01-16 LAB — LIPID PANEL
Cholesterol: 151 mg/dL (ref 0–200)
HDL: 43.9 mg/dL (ref >39.00)
LDL Cholesterol: 85 mg/dL (ref 0–99)
NonHDL: 107.01
Total CHOL/HDL Ratio: 3
Triglycerides: 112 mg/dL (ref 0.0–149.0)
VLDL: 22.4 mg/dL (ref 0.0–40.0)

## 2024-01-16 LAB — MICROALBUMIN / CREATININE URINE RATIO
Creatinine,U: 60.7 mg/dL
Microalb Creat Ratio: 17.3 mg/g (ref 0.0–30.0)
Microalb, Ur: 1 mg/dL (ref 0.0–1.9)

## 2024-01-16 LAB — HEMOGLOBIN A1C: Hgb A1c MFr Bld: 7.3 % — ABNORMAL HIGH (ref 4.6–6.5)

## 2024-01-16 LAB — TSH: TSH: 0.32 u[IU]/mL — ABNORMAL LOW (ref 0.35–5.50)

## 2024-01-16 LAB — T4, FREE: Free T4: 1.17 ng/dL (ref 0.60–1.60)

## 2024-01-16 NOTE — Patient Instructions (Addendum)
 Give us  2-3 business days to get the results of your labs back.   Keep the diet clean and stay active.  Hold your Zetia  and Lipitor for the next 2 weeks and call to update us  with how you are doing.   Stay hydrated.  For the muscle cramping, drink lots of fluids. Also take a spoonful of pickle juice nightly. An alternative would be a teaspoon of mustard, but most people prefer pickle juice.   If you do not hear anything about your referral in the next 1-2 weeks, call our office and ask for an update.  Let us  know if you need anything.

## 2024-01-16 NOTE — Progress Notes (Signed)
 Subjective:   Chief Complaint  Patient presents with   Diabetes    Diabetes     Jeffery Farrell is a 63 y.o. male here for follow-up of diabetes.   Zyair does not routinely check his sugars.  Patient does not require insulin.   Medications include: Amaryl  4 mg bid, Metformin  XR 1000 mg bid Diet is healthy.  Exercise: active at work  Hyperlipidemia Patient presents for dyslipidemia follow up. Currently being treated with Lipitor 80 mg/d, Zetia  10 mg/d, Tricor  145 mg/d and compliance with treatment thus far has been good. He complains of myalgias. Diet/exercise as above.  The patient is not known to have coexisting coronary artery disease.  GAD Hx of GAD. Takes Xanax  prn. No AE's and it works well. Does not follow w a therapist. No SI or HI. No self medication.   Hypothyroidism Patient presents for follow-up of hypothyroidism.  Reports compliance with medication- levothyroxine  150 mcg/d. Current symptoms include: denies fatigue, weight changes, heat/cold intolerance, bowel/skin changes or CVS symptoms He believes his dose should be not significantly changed  Past Medical History:  Diagnosis Date   Diabetes mellitus type 2 in obese    Hyperlipidemia    Hypertension      Related testing: Retinal exam: Due Pneumovax: done  Objective:  BP 128/86 (BP Location: Left Arm, Patient Position: Sitting)   Pulse 84   Temp 98 F (36.7 C) (Oral)   Resp 16   Ht 5' 6 (1.676 m)   Wt 167 lb (75.8 kg)   SpO2 97%   BMI 26.95 kg/m  General:  Well developed, well nourished, in no apparent distress Skin:  Warm, no pallor or diaphoresis Head:  Normocephalic, atraumatic Eyes:  Pupils equal and round, sclera anicteric without injection  Lungs:  CTAB, no access msc use Cardio:  RRR, no bruits, no LE edema Musculoskeletal:  Symmetrical muscle groups noted without atrophy or deformity Neuro:  Sensation intact to pinprick on feet Psych: Age appropriate judgment and insight  Assessment:    Type 2 diabetes mellitus with obesity (HCC) - Plan: Comprehensive metabolic panel with GFR, Lipid panel, Hemoglobin A1c, Microalbumin / creatinine urine ratio, Ambulatory referral to Ophthalmology  Mixed hyperlipidemia  GAD (generalized anxiety disorder)  High risk medication use - Plan: Drug Monitoring Panel [phone] , Urine  Hypothyroidism, unspecified type - Plan: TSH, T4, free   Plan:   Chronic, stable. Cont Metformin  XR 1000 mg bid, Amaryl  4 mg bid. Counseled on diet and exercise. Ck above.  Chronic, may be having myalgias. Hold Zetia  and Lipitor. Cont Tricor  145 mg/d. Chronic, stable. CSC and UDS updated today.  Chronic, stable. Cont levothyroxine  150 mcg/d.  F/u in 6 mo. The patient voiced understanding and agreement to the plan.  Mabel Mt [place], DO 01/16/24 9:45 AM

## 2024-01-18 LAB — DRUG MONITORING PANEL 376104, URINE
Amphetamines: NEGATIVE ng/mL (ref <500)
Barbiturates: NEGATIVE ng/mL (ref <300)
Benzodiazepines: NEGATIVE ng/mL (ref <100)
Cocaine Metabolite: NEGATIVE ng/mL (ref <150)
Desmethyltramadol: NEGATIVE ng/mL (ref <100)
Opiates: NEGATIVE ng/mL (ref <100)
Oxycodone: NEGATIVE ng/mL (ref <100)
Tramadol: NEGATIVE ng/mL (ref <100)

## 2024-01-18 LAB — DM TEMPLATE

## 2024-01-19 ENCOUNTER — Telehealth: Payer: Self-pay

## 2024-01-19 NOTE — Telephone Encounter (Signed)
 done

## 2024-01-30 ENCOUNTER — Other Ambulatory Visit: Payer: Self-pay | Admitting: Family Medicine

## 2024-02-20 LAB — HM DIABETES EYE EXAM

## 2024-03-01 ENCOUNTER — Encounter: Payer: Self-pay | Admitting: Family Medicine

## 2024-03-01 ENCOUNTER — Ambulatory Visit (INDEPENDENT_AMBULATORY_CARE_PROVIDER_SITE_OTHER): Admitting: Family Medicine

## 2024-03-01 VITALS — BP 124/82 | HR 98 | Temp 98.0°F | Resp 18 | Ht 66.0 in | Wt 166.4 lb

## 2024-03-01 DIAGNOSIS — L02214 Cutaneous abscess of groin: Secondary | ICD-10-CM | POA: Diagnosis not present

## 2024-03-01 MED ORDER — CEFTRIAXONE SODIUM 1 G IJ SOLR
1.0000 g | Freq: Once | INTRAMUSCULAR | Status: AC
  Administered 2024-03-01: 1 g via INTRAMUSCULAR

## 2024-03-01 MED ORDER — DOXYCYCLINE HYCLATE 100 MG PO TABS: 100.0000 mg | ORAL_TABLET | Freq: Two times a day (BID) | ORAL | 0 refills | Status: DC

## 2024-03-01 NOTE — Progress Notes (Signed)
 Subjective:    Patient ID: Jeffery Farrell, male    DOB: 08-10-1960, 63 y.o.   MRN: [id number]  Chief Complaint  Patient presents with   Groin Swelling    Right side, pt states having itching, swelling, and pain     HPI Patient is in today for abscess R groin.  Tender with walking , Discussed the use of AI scribe software for clinical note transcription with the patient, who gave verbal consent to proceed.  History of Present Illness Jeffery Farrell is a 63 year old male who presents with a rapidly enlarging painful mass in the right groin area.  He developed a painful mass in the right groin area approximately one week ago. Initially small, the mass has grown rapidly and is now approaching the size of a golf ball. 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 Initially, the mass itched, but it has since become painful, especially with movement.  He has attempted self-care measures, including warm compresses and saltwater-soaked cotton swabs, without relief. He has been taking ibuprofen for pain management, consuming three tablets this morning and four yesterday. The mass does not cause pain when touched but is uncomfortable when walking due to its size and location. Sitting provides some relief as it reduces pressure on the area. He is concerned about the impact on his ability to work, as his job involves physical activity, including climbing ladders.  He has a history of a vasectomy performed approximately thirty years ago. He is currently using Archdel drug, though the specific medication is not detailed. 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 He wears loose boxers for comfort.    Past Medical History:  Diagnosis Date   Diabetes mellitus type 2 in obese    Hyperlipidemia    Hypertension     Past Surgical History:  Procedure Laterality Date    COLONOSCOPY  2012   In [place]-normal exam per pt   VASECTOMY      Family History  Problem Relation Age of Onset   Colon cancer Neg Hx    Esophageal cancer Neg Hx    Rectal cancer Neg Hx    Colon polyps Neg Hx     Social History   Socioeconomic History   Marital status: Married    Spouse name: Not on file   Number of children: Not on file   Years of education: Not on file   Highest education level: Not on file  Occupational History   Not on file  Tobacco Use   Smoking status: Every Day    Current packs/day: 0.10    Types: Cigarettes   Smokeless tobacco: Never  Vaping Use   Vaping status: Every Day   Substances: Nicotine  Substance and Sexual Activity   Alcohol use: Yes    Alcohol/week: 7.0 standard drinks of alcohol    Types: 7 Cans of beer per week   Drug use: Never   Sexual activity: Not on file  Other Topics Concern   Not on file  Social History Narrative   Not on file   Social Drivers of Health   Financial Resource Strain: Not on file  Food Insecurity: Not on file  Transportation Needs: Not on file  Physical Activity: Not on file  Stress: Not on file  Social Connections: Not on file  Intimate Partner Violence: Not on file  Outpatient Medications Prior to Visit  Medication Sig Dispense Refill   ALPRAZolam  (XANAX ) 0.5 MG tablet TAKE 1 TABLET BY MOUTH EVERY DAY AS NEEDED FOR ANXIETY 90 tablet 1   atorvastatin  (LIPITOR) 80 MG tablet TAKE 1 TABLET BY MOUTH EVERY DAY FOR CHOLESTEROL 90 tablet 0   cholecalciferol (VITAMIN D3) 25 MCG (1000 UNIT) tablet Take 1,000 Units by mouth daily.     cyanocobalamin (VITAMIN B12) 1000 MCG tablet Take 1,000 mcg by mouth daily.     ezetimibe  (ZETIA ) 10 MG tablet Take 1 tablet (10 mg total) by mouth daily. 90 tablet 3   fenofibrate  (TRICOR ) 145 MG tablet Take 1 tablet (145 mg total) by mouth daily. 90 tablet 0   fluticasone  (FLONASE ) 50 MCG/ACT nasal spray Place 2 sprays into both nostrils daily. 16 g 1    glimepiride  (AMARYL ) 4 MG tablet TAKE 1 TABLET BY MOUTH 2 TIMES A DAY FORBLOOD SUGAR 180 tablet 1   levothyroxine  (SYNTHROID ) 150 MCG tablet TAKE 1 TABLET BY MOUTH EVERY DAY 90 tablet 3   magnesium  oxide (MAG-OX) 400 (240 Mg) MG tablet TAKE 1 TABLET BY MOUTH TWICE DAILY 60 tablet 1   metFORMIN  (GLUCOPHAGE -XR) 500 MG 24 hr tablet Take 2 tablets (1,000 mg total) by mouth 2 (two) times daily with a meal. 360 tablet 2   omeprazole (PRILOSEC) 40 MG capsule Take 1 capsule (40 mg total) by mouth 2 (two) times daily before a meal. 180 capsule 1   tobramycin  (TOBREX ) 0.3 % ophthalmic solution Place 2 drops into both eyes every 6 (six) hours. 5 mL 0   No facility-administered medications prior to visit.    Allergies  Allergen Reactions   Metformin  Other (See Comments)    GI issues GI issues    Indomethacin Other (See Comments)    GI issues     Review of Systems  Constitutional:  Negative for fever and malaise/fatigue.  HENT:  Negative for congestion.   Eyes:  Negative for blurred vision.  Respiratory:  Negative for shortness of breath.   Cardiovascular:  Negative for chest pain, palpitations and leg swelling.  Gastrointestinal:  Negative for abdominal pain, blood in stool and nausea.  Genitourinary:  Negative for dysuria and frequency.  Musculoskeletal:  Negative for falls.  Skin:  Negative for rash.       Abscess R groin   Neurological:  Negative for dizziness, loss of consciousness and headaches.  Endo/Heme/Allergies:  Negative for environmental allergies.  Psychiatric/Behavioral:  Negative for depression. The patient is not nervous/anxious.        Objective:    Physical Exam Vitals and nursing note reviewed.  Constitutional:      General: He is not in acute distress.    Appearance: Normal appearance. He is well-developed.  HENT:     Head: Normocephalic and atraumatic.  Eyes:     General: No scleral icterus.       Right eye: No discharge.        Left eye: No discharge.   Cardiovascular:     Rate and Rhythm: Normal rate and regular rhythm.     Heart sounds: No murmur heard. Pulmonary:     Effort: Pulmonary effort is normal. No respiratory distress.     Breath sounds: Normal breath sounds.  Genitourinary:    Comments: Abscess R scrotum ---  about 2 in diameter , no d/c  Musculoskeletal:        General: Normal range of motion.     Cervical back: Normal range  of motion and neck supple.     Right lower leg: No edema.     Left lower leg: No edema.  Skin:    General: Skin is warm and dry.  Neurological:     Mental Status: He is alert and oriented to person, place, and time.  Psychiatric:        Mood and Affect: Mood normal.        Behavior: Behavior normal.        Thought Content: Thought content normal.        Judgment: Judgment normal.     BP 124/82 (BP Location: Left Arm, Patient Position: Sitting, Cuff Size: Normal)   Pulse 98   Temp 98 F (36.7 C) (Oral)   Resp 18   Ht 5' 6 (1.676 m)   Wt 166 lb 6.4 oz (75.5 kg)   SpO2 98%   BMI 26.86 kg/m  Wt Readings from Last 3 Encounters:  03/01/24 166 lb 6.4 oz (75.5 kg)  01/16/24 167 lb (75.8 kg)  12/29/23 168 lb (76.2 kg)    Diabetic Foot Exam - Simple   No data filed    Lab Results  Component Value Date   WBC 6.1 07/11/2023   HGB 13.5 07/11/2023   HCT 39.6 07/11/2023   PLT 356.0 07/11/2023   GLUCOSE 110 (H) 01/16/2024   CHOL 151 01/16/2024   TRIG 112.0 01/16/2024   HDL 43.90 01/16/2024   LDLDIRECT 80.0 07/05/2022   LDLCALC 85 01/16/2024   ALT 51 01/16/2024   AST 42 (H) 01/16/2024   NA 136 01/16/2024   K 4.8 01/16/2024   CL 100 01/16/2024   CREATININE 1.19 01/16/2024   BUN 17 01/16/2024   CO2 28 01/16/2024   TSH 0.32 (L) 01/16/2024   PSA 0.23 07/11/2023   HGBA1C 7.3 (H) 01/16/2024   MICROALBUR 1.0 01/16/2024    Lab Results  Component Value Date   TSH 0.32 (L) 01/16/2024   Lab Results  Component Value Date   WBC 6.1 07/11/2023   HGB 13.5 07/11/2023   HCT 39.6  07/11/2023   MCV 94.2 07/11/2023   PLT 356.0 07/11/2023   Lab Results  Component Value Date   NA 136 01/16/2024   K 4.8 01/16/2024   CO2 28 01/16/2024   GLUCOSE 110 (H) 01/16/2024   BUN 17 01/16/2024   CREATININE 1.19 01/16/2024   BILITOT 0.9 01/16/2024   ALKPHOS 39 01/16/2024   AST 42 (H) 01/16/2024   ALT 51 01/16/2024   PROT 7.4 01/16/2024   ALBUMIN 4.7 01/16/2024   CALCIUM  10.5 01/16/2024   GFR 64.56 01/16/2024   Lab Results  Component Value Date   CHOL 151 01/16/2024   Lab Results  Component Value Date   HDL 43.90 01/16/2024   Lab Results  Component Value Date   LDLCALC 85 01/16/2024   Lab Results  Component Value Date   TRIG 112.0 01/16/2024   Lab Results  Component Value Date   CHOLHDL 3 01/16/2024   Lab Results  Component Value Date   HGBA1C 7.3 (H) 01/16/2024       Assessment & Plan:  Abscess of groin, right -     Doxycycline  Hyclate; Take 1 tablet (100 mg total) by mouth 2 (two) times daily.  Dispense: 20 tablet; Refill: 0 -     Ambulatory referral to General Surgery  Assessment and Plan Assessment & Plan Right scrotal abscess   He presents with a right scrotal abscess for about one week, slightly smaller  than a golf ball, causing significant discomfort when walking but not when sitting. It is non-tender to touch. Home remedies, including warm compresses and saltwater-soaked cotton swabs, have been ineffective. The abscess is in a complicated anatomical area, raising concerns about potential complications if drained improperly. Prescribe antibiotics for infection. Refer to a surgeon for evaluation and potential drainage of the abscess.    Tiah Heckel R Lowne Chase, DO

## 2024-03-05 ENCOUNTER — Ambulatory Visit: Admitting: Family Medicine

## 2024-04-07 ENCOUNTER — Other Ambulatory Visit: Payer: Self-pay | Admitting: Family Medicine

## 2024-04-07 DIAGNOSIS — E782 Mixed hyperlipidemia: Secondary | ICD-10-CM

## 2024-04-07 NOTE — Telephone Encounter (Signed)
 Copied from CRM #[id number]. Topic: Clinical - Medication Refill >> Apr 07, 2024 12:05 PM Frederich PARAS wrote: Medication: ladothyrozine , fenofidrate  Has the patient contacted their pharmacy? Yes (Pharmacy told him to give us  a call   This is the patient's preferred pharmacy:  [facility] - [place], [place] - [address] [place] [address] Phone: [phone] Fax: [phone]  Is this the correct pharmacy for this prescription? Yes If no, delete pharmacy and type the correct one.   Has the prescription been filled recently? Yes  Is the patient out of the medication? No  Has the patient been seen for an appointment in the last year OR does the patient have an upcoming appointment? Yes  Can we respond through MyChart? No  Agent: Please be advised that Rx refills may take up to 3 business days. We ask that you follow-up with your pharmacy.

## 2024-04-07 NOTE — Telephone Encounter (Signed)
 Chart not reviewed. Refills already sent today. Error CRM sent.

## 2024-04-19 ENCOUNTER — Other Ambulatory Visit: Payer: Self-pay | Admitting: Family Medicine

## 2024-04-19 DIAGNOSIS — R739 Hyperglycemia, unspecified: Secondary | ICD-10-CM

## 2024-04-19 DIAGNOSIS — E669 Obesity, unspecified: Secondary | ICD-10-CM

## 2024-06-12 ENCOUNTER — Other Ambulatory Visit: Payer: Self-pay | Admitting: Family Medicine

## 2024-07-23 ENCOUNTER — Encounter: Payer: Self-pay | Admitting: Family Medicine

## 2024-07-23 ENCOUNTER — Ambulatory Visit: Admitting: Family Medicine

## 2024-07-23 ENCOUNTER — Other Ambulatory Visit: Payer: Self-pay | Admitting: Family Medicine

## 2024-07-23 ENCOUNTER — Ambulatory Visit: Payer: Self-pay | Admitting: Family Medicine

## 2024-07-23 VITALS — BP 136/82 | HR 100 | Temp 98.0°F | Resp 16 | Ht 66.0 in | Wt 173.8 lb

## 2024-07-23 DIAGNOSIS — Z136 Encounter for screening for cardiovascular disorders: Secondary | ICD-10-CM

## 2024-07-23 DIAGNOSIS — Z6828 Body mass index (BMI) 28.0-28.9, adult: Secondary | ICD-10-CM | POA: Diagnosis not present

## 2024-07-23 DIAGNOSIS — Z0000 Encounter for general adult medical examination without abnormal findings: Secondary | ICD-10-CM

## 2024-07-23 DIAGNOSIS — E119 Type 2 diabetes mellitus without complications: Secondary | ICD-10-CM

## 2024-07-23 DIAGNOSIS — E669 Obesity, unspecified: Secondary | ICD-10-CM | POA: Diagnosis not present

## 2024-07-23 DIAGNOSIS — E039 Hypothyroidism, unspecified: Secondary | ICD-10-CM

## 2024-07-23 DIAGNOSIS — Z23 Encounter for immunization: Secondary | ICD-10-CM

## 2024-07-23 DIAGNOSIS — E782 Mixed hyperlipidemia: Secondary | ICD-10-CM

## 2024-07-23 LAB — COMPREHENSIVE METABOLIC PANEL WITH GFR
ALT: 58 U/L — ABNORMAL HIGH (ref 3–53)
AST: 43 U/L — ABNORMAL HIGH (ref 5–37)
Albumin: 4.5 g/dL (ref 3.5–5.2)
Alkaline Phosphatase: 44 U/L (ref 39–117)
BUN: 13 mg/dL (ref 6–23)
CO2: 28 meq/L (ref 19–32)
Calcium: 9.4 mg/dL (ref 8.4–10.5)
Chloride: 101 meq/L (ref 96–112)
Creatinine, Ser: 1.08 mg/dL (ref 0.40–1.50)
GFR: 72.27 mL/min
Glucose, Bld: 178 mg/dL — ABNORMAL HIGH (ref 70–99)
Potassium: 4.5 meq/L (ref 3.5–5.1)
Sodium: 138 meq/L (ref 135–145)
Total Bilirubin: 0.7 mg/dL (ref 0.2–1.2)
Total Protein: 7.3 g/dL (ref 6.0–8.3)

## 2024-07-23 LAB — CBC
HCT: 37.8 % — ABNORMAL LOW (ref 39.0–52.0)
Hemoglobin: 13 g/dL (ref 13.0–17.0)
MCHC: 34.4 g/dL (ref 30.0–36.0)
MCV: 93.8 fl (ref 78.0–100.0)
Platelets: 310 K/uL (ref 150.0–400.0)
RBC: 4.03 Mil/uL — ABNORMAL LOW (ref 4.22–5.81)
RDW: 13.5 % (ref 11.5–15.5)
WBC: 5.5 K/uL (ref 4.0–10.5)

## 2024-07-23 LAB — LIPID PANEL
Cholesterol: 129 mg/dL (ref 28–200)
HDL: 34.9 mg/dL — ABNORMAL LOW
LDL Cholesterol: 65 mg/dL (ref 10–99)
NonHDL: 93.98
Total CHOL/HDL Ratio: 4
Triglycerides: 144 mg/dL (ref 10.0–149.0)
VLDL: 28.8 mg/dL (ref 0.0–40.0)

## 2024-07-23 LAB — MICROALBUMIN / CREATININE URINE RATIO
Creatinine,U: 91.6 mg/dL
Microalb Creat Ratio: 9 mg/g (ref 0.0–30.0)
Microalb, Ur: 0.8 mg/dL (ref 0.7–1.9)

## 2024-07-23 LAB — T4, FREE: Free T4: 1.12 ng/dL (ref 0.60–1.60)

## 2024-07-23 LAB — HEMOGLOBIN A1C: Hgb A1c MFr Bld: 8.6 % — ABNORMAL HIGH (ref 4.6–6.5)

## 2024-07-23 LAB — TSH: TSH: 0.67 u[IU]/mL (ref 0.35–5.50)

## 2024-07-23 MED ORDER — PIOGLITAZONE HCL 30 MG PO TABS: 30.0000 mg | ORAL_TABLET | Freq: Every day | ORAL | 3 refills | Status: AC

## 2024-07-23 NOTE — Progress Notes (Signed)
 Chief Complaint  Patient presents with   Annual Exam    CPE    Well Male Jeffery Farrell is here for a complete physical.   His last physical was >1 year ago.  Current diet: in general, a healthy diet.   Current exercise: active at work Weight trend: up a few lbs Fatigue out of ordinary? No. Seat belt? Yes.   Advanced directive? No  Health maintenance Shingrix- No Colonoscopy- Yes Tetanus- Yes Hep C- Yes Pneumonia vaccine- Due AAA screening- No  Past Medical History:  Diagnosis Date   Diabetes mellitus type 2 in obese    Hyperlipidemia    Hypertension      Past Surgical History:  Procedure Laterality Date   COLONOSCOPY  2012   In [place]-normal exam per pt   VASECTOMY      Medications  Medications Ordered Prior to Encounter[1]   Allergies Allergies[2]  Family History Family History  Problem Relation Age of Onset   Colon cancer Neg Hx    Esophageal cancer Neg Hx    Rectal cancer Neg Hx    Colon polyps Neg Hx     Review of Systems: Constitutional:  no fevers Eye:  no recent significant change in vision Ears:  No changes in hearing Nose/Mouth/Throat:  no complaints of nasal congestion, no sore throat Cardiovascular: no chest pain Respiratory:  No shortness of breath Gastrointestinal:  No change in bowel habits GU:  No frequency Integumentary:  no abnormal skin lesions reported Neurologic:  no headaches Endocrine:  denies unexplained weight changes  Exam BP 136/82 (BP Location: Left Arm, Patient Position: Sitting)   Pulse 100   Temp 98 F (36.7 C) (Oral)   Resp 16   Ht 5' 6 (1.676 m)   Wt 173 lb 12.8 oz (78.8 kg)   SpO2 95%   BMI 28.05 kg/m  General:  well developed, well nourished, in no apparent distress Skin:  no significant moles, warts, or growths Head:  no masses, lesions, or tenderness Eyes:  pupils equal and round, sclera anicteric without injection Ears:  canals without lesions, TMs shiny without retraction, no obvious  effusion, no erythema Nose:  nares patent, mucosa normal Throat/Pharynx:  lips and gingiva without lesion; tongue and uvula midline; non-inflamed pharynx; no exudates or postnasal drainage Lungs:  clear to auscultation, breath sounds equal bilaterally, no respiratory distress Cardio:  regular rate and rhythm, no LE edema or bruits Rectal: Deferred GI: BS+, S, NT, ND, no masses or organomegaly Musculoskeletal:  symmetrical muscle groups noted without atrophy or deformity Neuro:  gait normal; deep tendon reflexes normal and symmetric Psych: well oriented with normal range of affect and appropriate judgment/insight  Assessment and Plan  Well adult exam - Plan: CBC, Comprehensive metabolic panel with GFR, Lipid panel  Type 2 diabetes mellitus in patient with obesity (HCC) - Plan: Hemoglobin A1c, Microalbumin / creatinine urine ratio  Hypothyroidism, unspecified type - Plan: TSH, T4, free  Screening for AAA (abdominal aortic aneurysm) - Plan: US  AORTA   Well 64 y.o. male. Counseled on diet and exercise. Other orders as above. Flu shot and PCV 20 today. AAA: US  ordered.  Advanced directive form provided today.  Follow up in 6 months.  The patient voiced understanding and agreement to the plan.  Jeffery Mt [place], DO 07/23/24 7:44 AM     [1]  Current Outpatient Medications on File Prior to Visit  Medication Sig Dispense Refill   ALPRAZolam  (XANAX ) 0.5 MG tablet TAKE 1 TABLET BY MOUTH  EVERY DAY AS NEEDED FOR ANXIETY 90 tablet 1   atorvastatin  (LIPITOR) 80 MG tablet TAKE 1 TABLET BY MOUTH EVERY DAY FOR CHOLESTEROL 90 tablet 0   cholecalciferol (VITAMIN D3) 25 MCG (1000 UNIT) tablet Take 1,000 Units by mouth daily.     cyanocobalamin  (VITAMIN B12) 1000 MCG tablet Take 1,000 mcg by mouth daily.     doxycycline  (VIBRA -TABS) 100 MG tablet Take 1 tablet (100 mg total) by mouth 2 (two) times daily. 20 tablet 0   ezetimibe  (ZETIA ) 10 MG tablet Take 1 tablet (10 mg total) by mouth  daily. 90 tablet 3   fenofibrate  (TRICOR ) 145 MG tablet Take 1 tablet (145 mg total) by mouth daily. 90 tablet 1   fluticasone  (FLONASE ) 50 MCG/ACT nasal spray Place 2 sprays into both nostrils daily. 16 g 1   glimepiride  (AMARYL ) 4 MG tablet TAKE 1 TABLET BY MOUTH 2 TIMES A DAY FOR BLOOD SUGAR 180 tablet 1   levothyroxine  (SYNTHROID ) 150 MCG tablet Take 1 tablet (150 mcg total) by mouth daily before breakfast. 90 tablet 1   magnesium  oxide (MAG-OX) 400 (240 Mg) MG tablet TAKE 1 TABLET BY MOUTH TWICE DAILY 60 tablet 1   metFORMIN  (GLUCOPHAGE -XR) 500 MG 24 hr tablet Take 2 tablets (1,000 mg total) by mouth 2 (two) times daily with a meal. 360 tablet 2   omeprazole (PRILOSEC) 40 MG capsule Take 1 capsule (40 mg total) by mouth 2 (two) times daily before a meal. 180 capsule 1   tobramycin  (TOBREX ) 0.3 % ophthalmic solution Place 2 drops into both eyes every 6 (six) hours. 5 mL 0   No current facility-administered medications on file prior to visit.  [2]  Allergies Allergen Reactions   Metformin  Other (See Comments)    GI issues GI issues    Indomethacin Other (See Comments)    GI issues

## 2024-07-23 NOTE — Patient Instructions (Addendum)
 Give us  2-3 business days to get the results of your labs back.   Keep the diet clean and stay active.  Please get me a copy of your advanced directive form at your convenience.   To prepare a bleach bath, one-fourth to one-half cup of bleach is placed in a full bathtub (about 40 gallons) of water. Bleach baths are usually taken for 5 to 10 minutes twice per week and should be followed by application of an emollient.  If you start having the beginnings of a boil, put some triple antibiotic ointment on it twice daily to help head it off.   Foods that may reduce pain: 1) Ginger 2) Blueberries 3) Salmon 4) Pumpkin seeds 5) Dark chocolate 6) Turmeric 7) Tart cherries 8) Virgin olive oil 9) Chili peppers 10) Mint 11) Krill oil  Let us  know if you need anything.

## 2024-07-23 NOTE — Addendum Note (Signed)
 Addended by: Michon Kaczmarek M on: 07/23/2024 09:10 AM   Modules accepted: Orders

## 2024-07-31 ENCOUNTER — Other Ambulatory Visit: Payer: Self-pay | Admitting: Family Medicine

## 2024-07-31 DIAGNOSIS — F411 Generalized anxiety disorder: Secondary | ICD-10-CM

## 2024-07-31 DIAGNOSIS — R739 Hyperglycemia, unspecified: Secondary | ICD-10-CM

## 2024-07-31 DIAGNOSIS — E669 Obesity, unspecified: Secondary | ICD-10-CM

## 2024-08-02 NOTE — Telephone Encounter (Signed)
 Requesting: alprazolam  0.5mg   Contract:01/13/23 UDS:01/16/24 Last Visit:07/23/24 Next Visit: 10/15/24 Last Refill: 01/15/24 #90 and 1RF   Please Advise

## 2024-08-12 ENCOUNTER — Ambulatory Visit: Payer: Self-pay | Admitting: Family Medicine

## 2024-08-12 ENCOUNTER — Other Ambulatory Visit: Payer: Self-pay | Admitting: Family Medicine

## 2024-08-12 ENCOUNTER — Telehealth: Payer: Self-pay | Admitting: Family Medicine

## 2024-08-12 ENCOUNTER — Ambulatory Visit (HOSPITAL_BASED_OUTPATIENT_CLINIC_OR_DEPARTMENT_OTHER)
Admission: RE | Admit: 2024-08-12 | Discharge: 2024-08-12 | Disposition: A | Source: Ambulatory Visit | Attending: Family Medicine

## 2024-08-12 DIAGNOSIS — E119 Type 2 diabetes mellitus without complications: Secondary | ICD-10-CM

## 2024-08-12 DIAGNOSIS — Z23 Encounter for immunization: Secondary | ICD-10-CM

## 2024-08-12 DIAGNOSIS — Z0000 Encounter for general adult medical examination without abnormal findings: Secondary | ICD-10-CM

## 2024-08-12 DIAGNOSIS — Z136 Encounter for screening for cardiovascular disorders: Secondary | ICD-10-CM

## 2024-08-12 DIAGNOSIS — E039 Hypothyroidism, unspecified: Secondary | ICD-10-CM

## 2024-08-12 NOTE — Telephone Encounter (Signed)
 Disregard.

## 2024-10-15 ENCOUNTER — Ambulatory Visit: Admitting: Family Medicine

## 2025-01-21 ENCOUNTER — Ambulatory Visit: Admitting: Family Medicine
